# Patient Record
Sex: Male | Born: 1970 | Race: White | Hispanic: No | Marital: Married | Smoking: Never smoker
Health system: Southern US, Community
[De-identification: ages and names within clinical notes are randomized; demographics above are authoritative.]

## PROBLEM LIST (undated history)

## (undated) DIAGNOSIS — F419 Anxiety disorder, unspecified: Secondary | ICD-10-CM

## (undated) DIAGNOSIS — I10 Essential (primary) hypertension: Secondary | ICD-10-CM

## (undated) DIAGNOSIS — K589 Irritable bowel syndrome without diarrhea: Secondary | ICD-10-CM

## (undated) HISTORY — DX: Essential (primary) hypertension: I10

## (undated) HISTORY — PX: ANKLE ARTHROSCOPY W/ OPEN REPAIR: SHX1145

## (undated) HISTORY — PX: VASECTOMY: SHX75

## (undated) HISTORY — DX: Irritable bowel syndrome, unspecified: K58.9

## (undated) HISTORY — PX: VASECTOMY REVERSAL: SHX243

## (undated) HISTORY — DX: Anxiety disorder, unspecified: F41.9

---

## 2011-09-13 DIAGNOSIS — N401 Enlarged prostate with lower urinary tract symptoms: Secondary | ICD-10-CM | POA: Insufficient documentation

## 2011-09-13 DIAGNOSIS — N411 Chronic prostatitis: Secondary | ICD-10-CM | POA: Insufficient documentation

## 2011-09-15 DIAGNOSIS — E291 Testicular hypofunction: Secondary | ICD-10-CM | POA: Insufficient documentation

## 2011-09-15 DIAGNOSIS — N529 Male erectile dysfunction, unspecified: Secondary | ICD-10-CM | POA: Insufficient documentation

## 2011-09-15 DIAGNOSIS — R350 Frequency of micturition: Secondary | ICD-10-CM | POA: Insufficient documentation

## 2011-11-23 ENCOUNTER — Ambulatory Visit: Payer: Self-pay | Admitting: Family Medicine

## 2012-11-11 ENCOUNTER — Inpatient Hospital Stay: Payer: Self-pay | Admitting: Orthopedic Surgery

## 2013-03-07 ENCOUNTER — Ambulatory Visit: Payer: Self-pay | Admitting: Orthopedic Surgery

## 2013-05-17 DIAGNOSIS — Z8781 Personal history of (healed) traumatic fracture: Secondary | ICD-10-CM | POA: Insufficient documentation

## 2013-05-17 DIAGNOSIS — Z9889 Other specified postprocedural states: Secondary | ICD-10-CM | POA: Insufficient documentation

## 2013-09-25 ENCOUNTER — Ambulatory Visit: Payer: Self-pay | Admitting: Otolaryngology

## 2013-10-29 IMAGING — CR DG SHOULDER 3+V*R*
1 series · 3 of 3 positions shown · non-contrast
Comparison: none

REASON FOR EXAM: pain
COMMENTS:

PROCEDURE:     KDR - KDXR SHOULDER RIGHT COMPLETE  - November 23, 2011  [DATE]
RESULT:     Comparison: None.

[Series 1: internal rotate · 0.17mm/px · 3 of 3 slices shown]
[im 1/3]
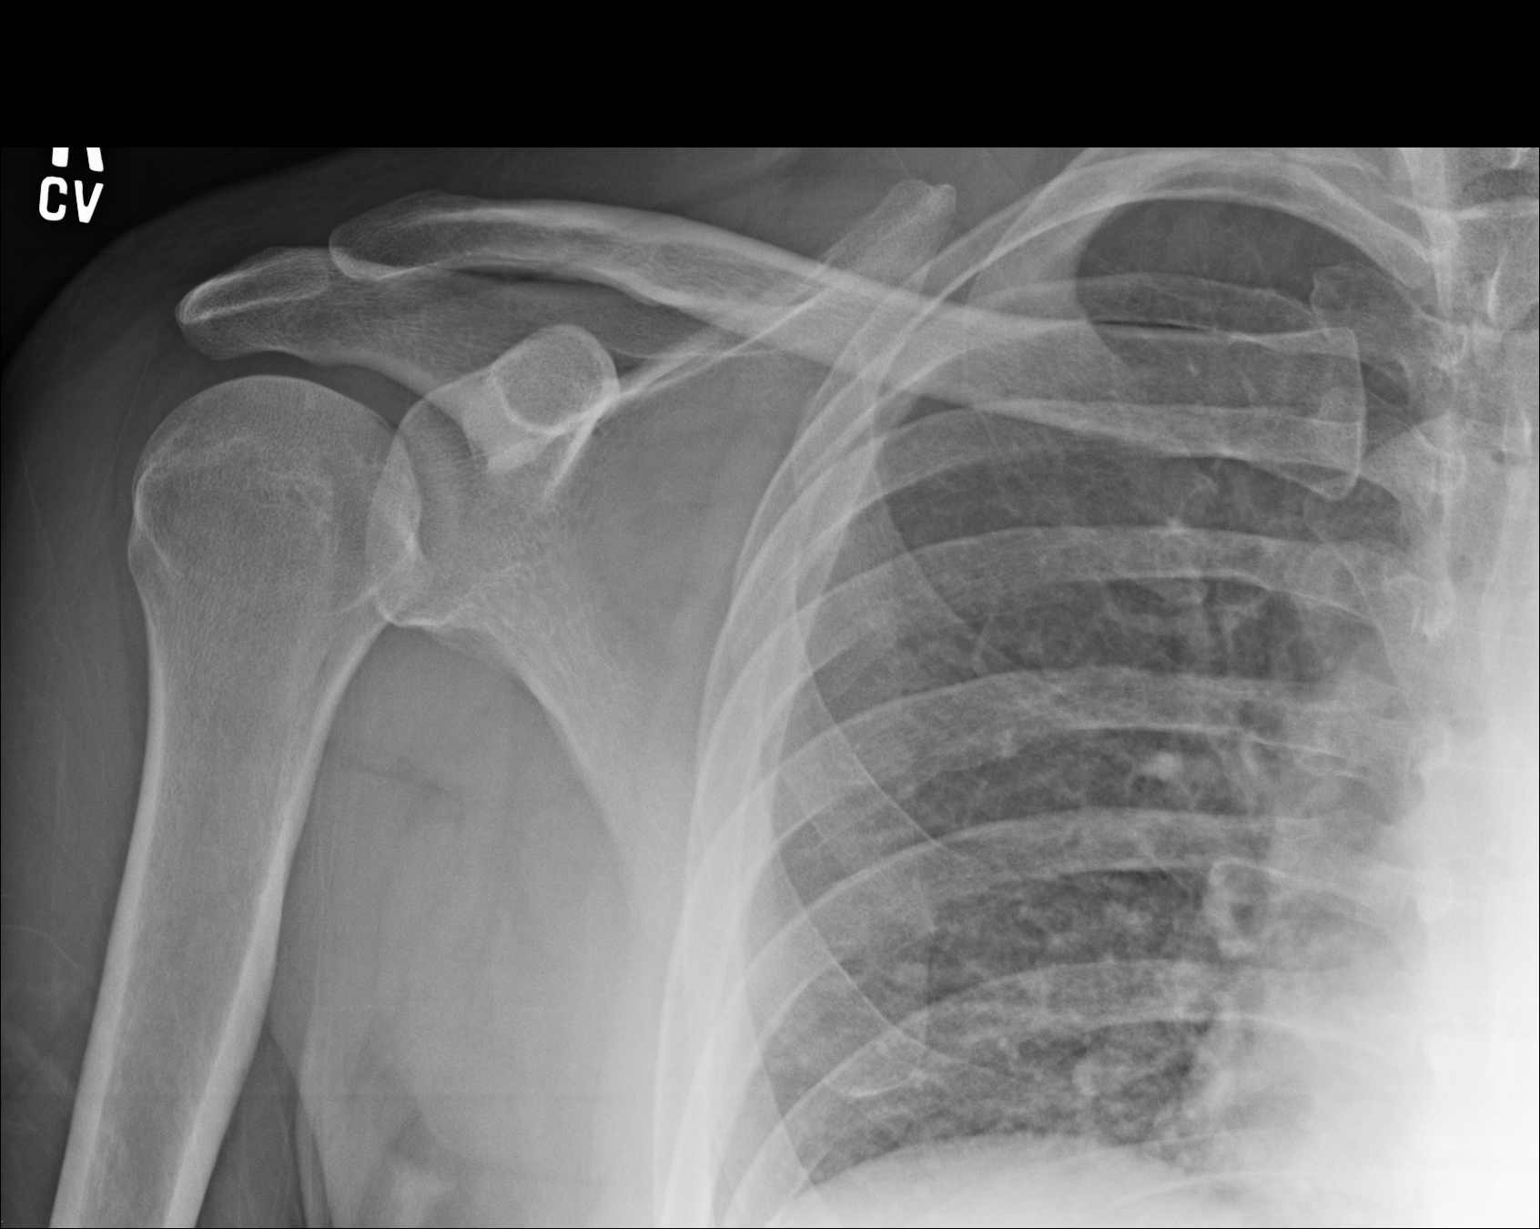
[im 2/3]
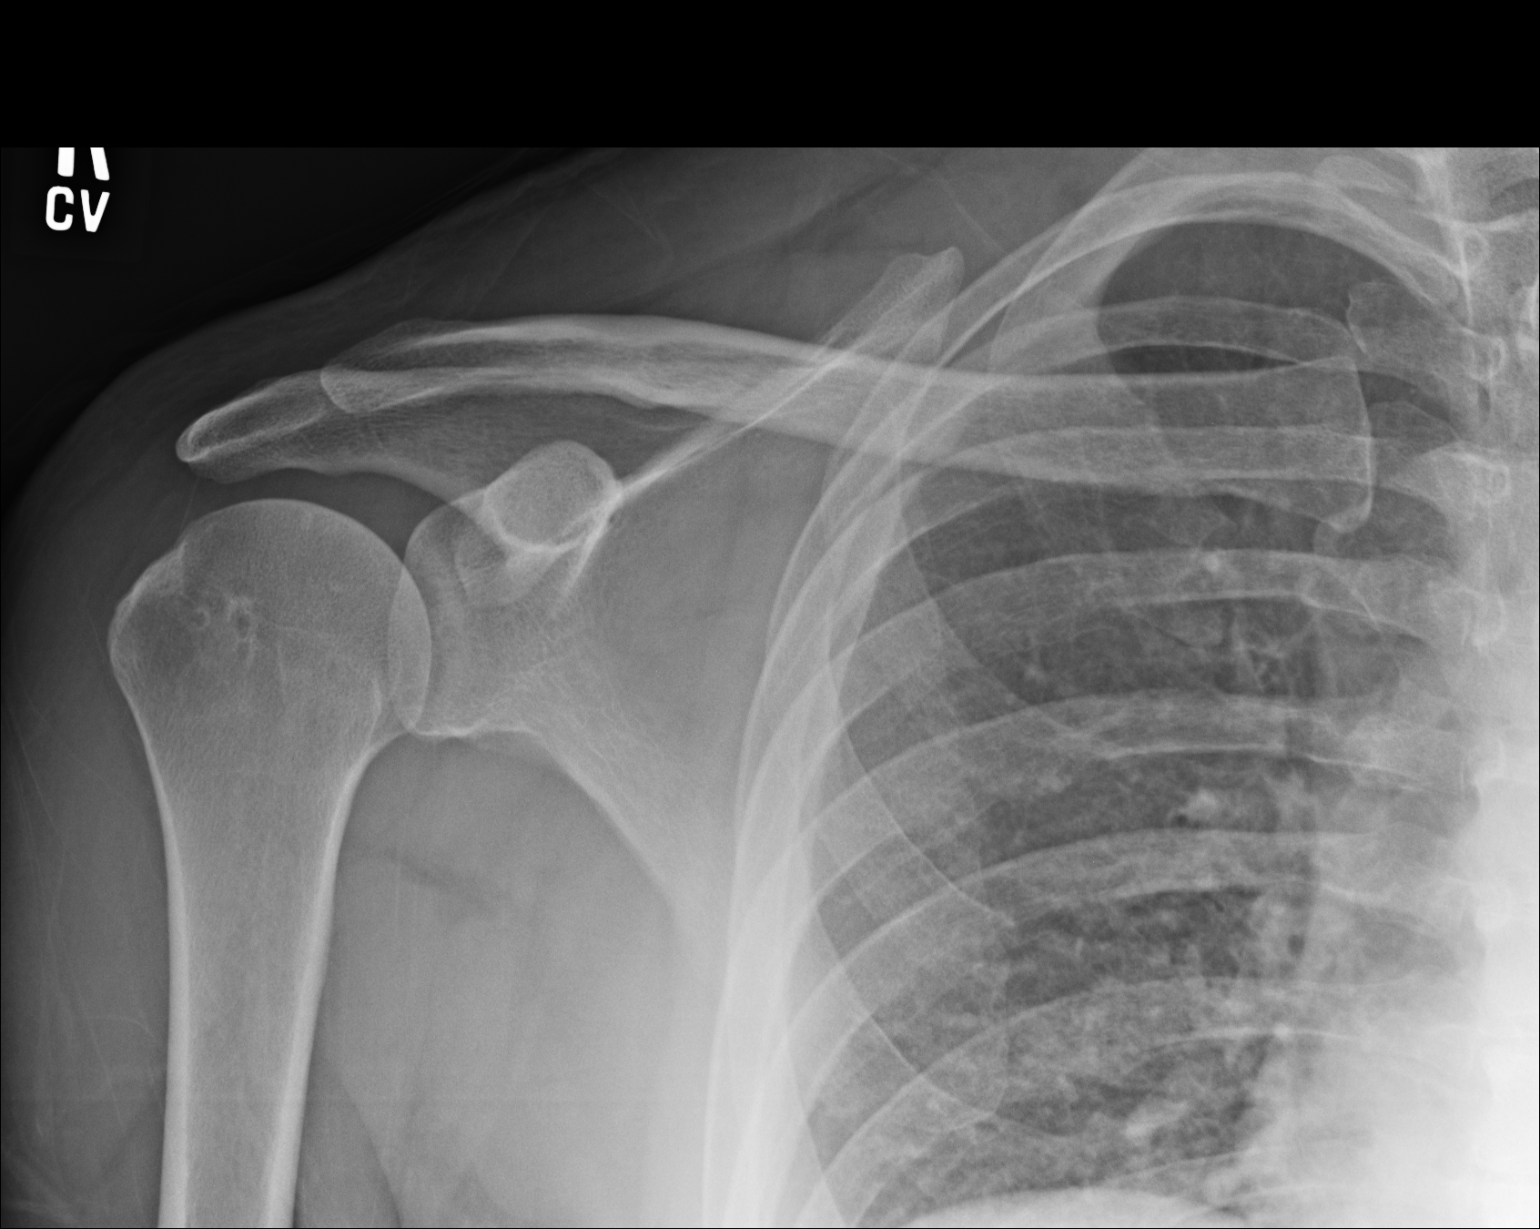
[im 3/3]
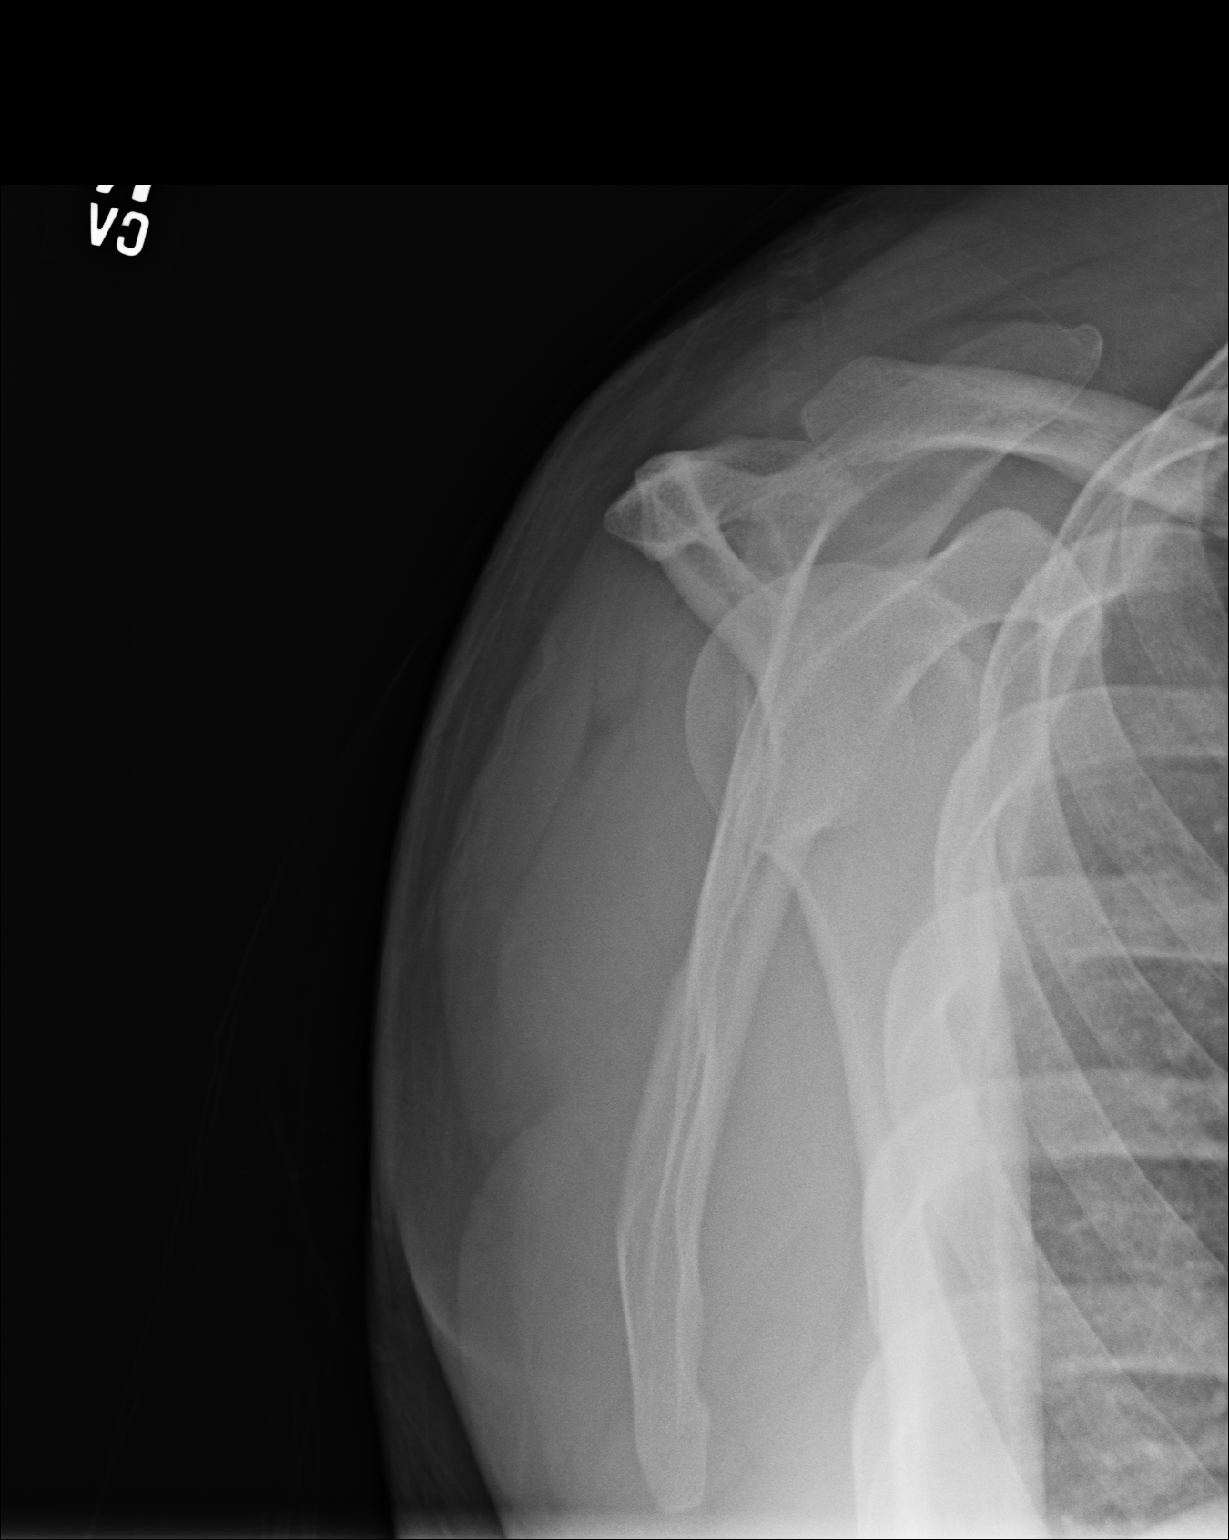

[3 of 3 positions shown; findings below may reference images not displayed]

FINDINGS: No acute fracture or dislocation. The acromioclavicular joint is
unremarkable.
IMPRESSION: No acute findings.

[REDACTED]

## 2013-10-29 IMAGING — CR DG LUMBAR SPINE 2-3V
1 series · 4 of 4 positions shown · non-contrast
Comparison: none

REASON FOR EXAM: lumbago back pain
COMMENTS:

PROCEDURE:     KDR - KDXR LUMBAR SPINE AP AND LATERAL  - November 23, 2011  [DATE]
RESULT:     Comparison: None.

[Series 1: ap · 0.17mm/px · 4 of 4 slices shown]
[im 1/4]
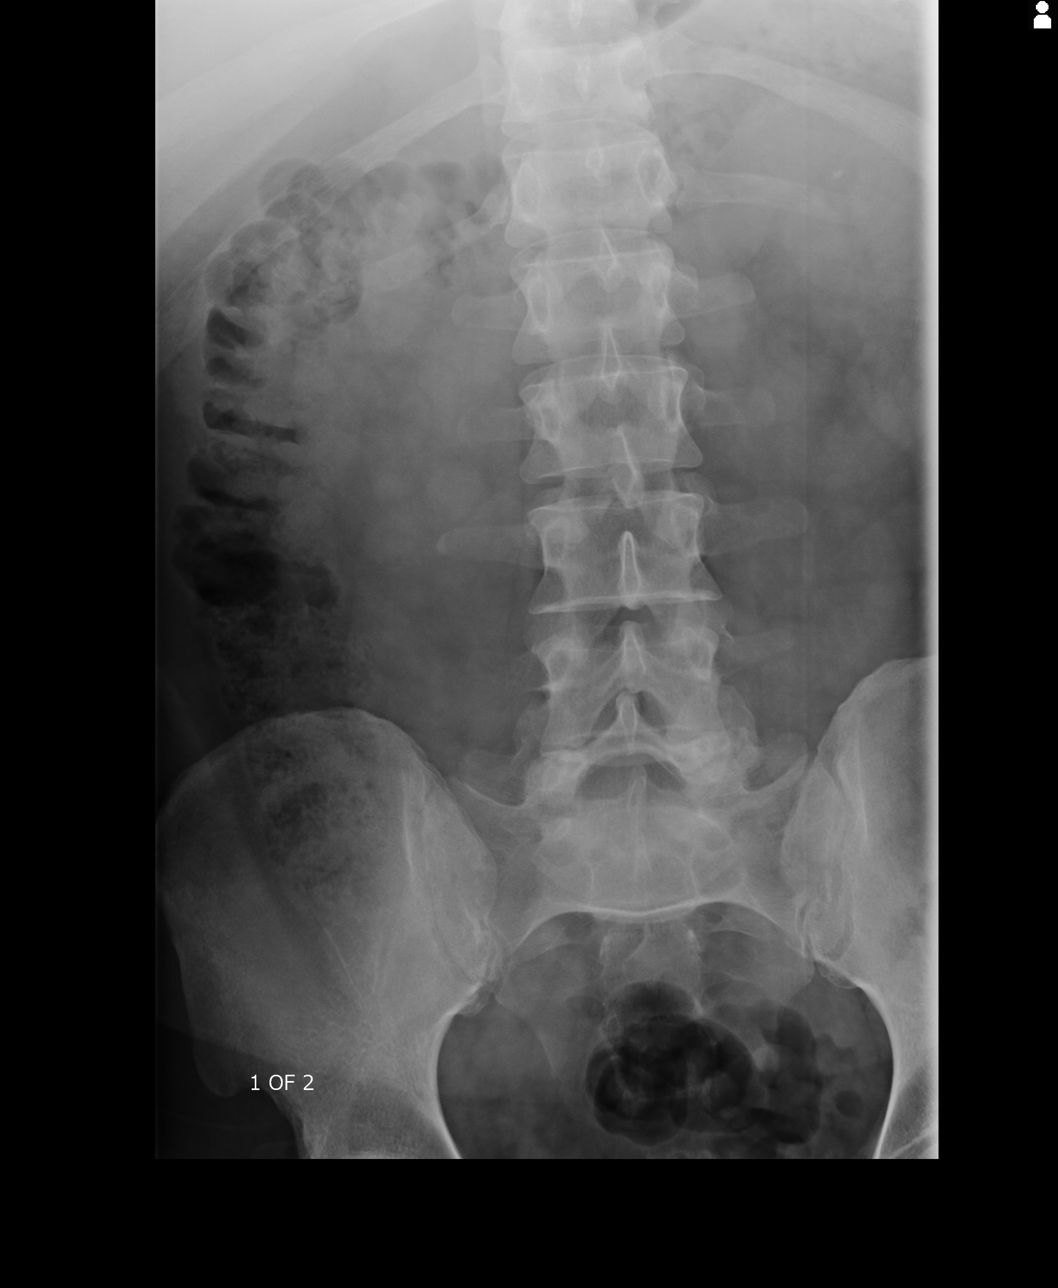
[im 2/4]
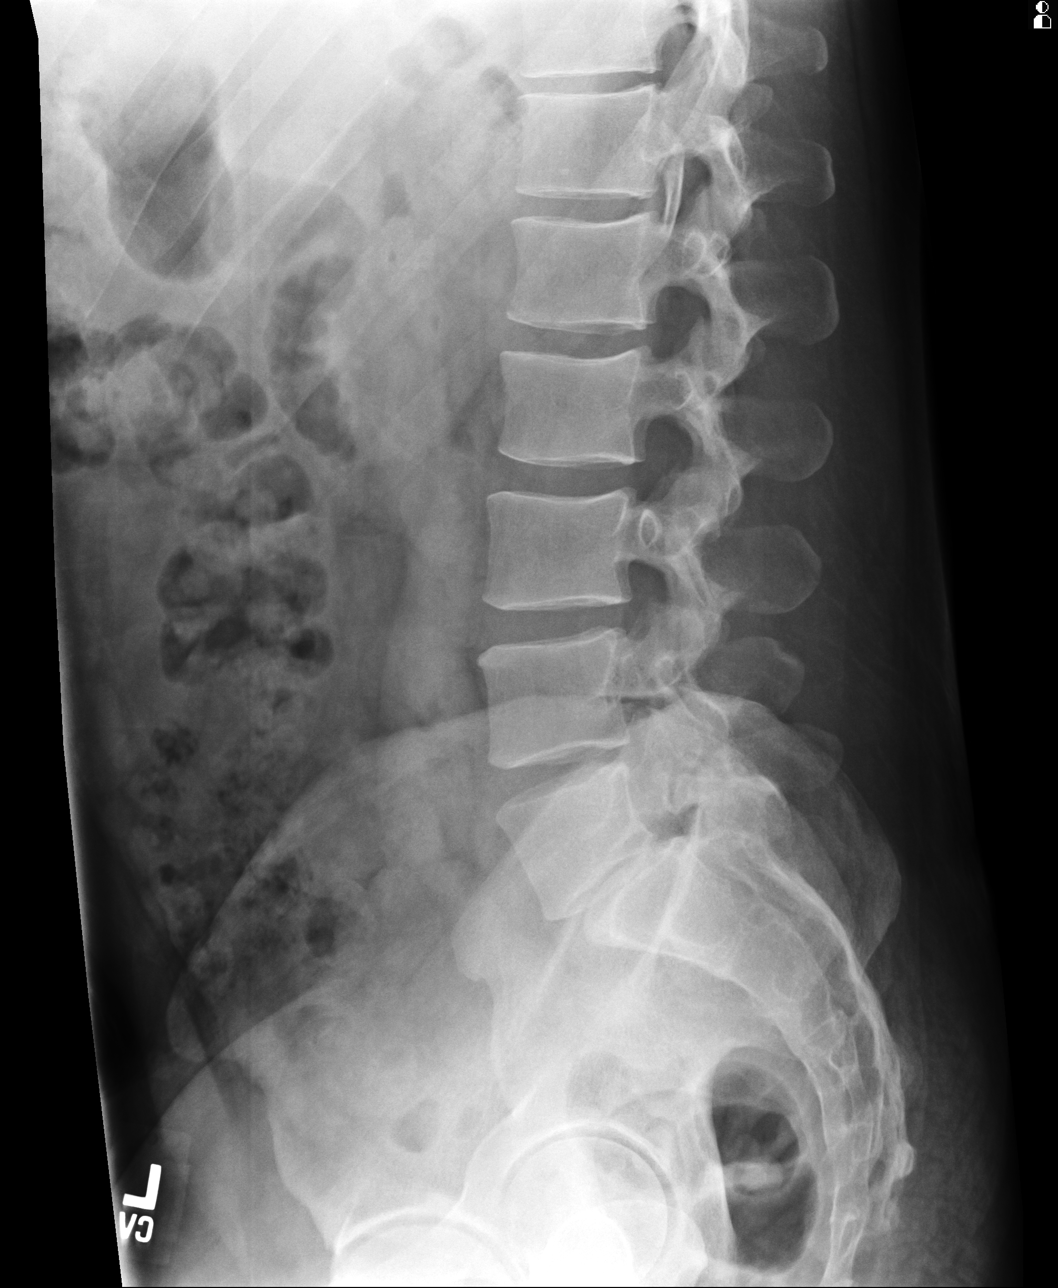
[im 3/4]
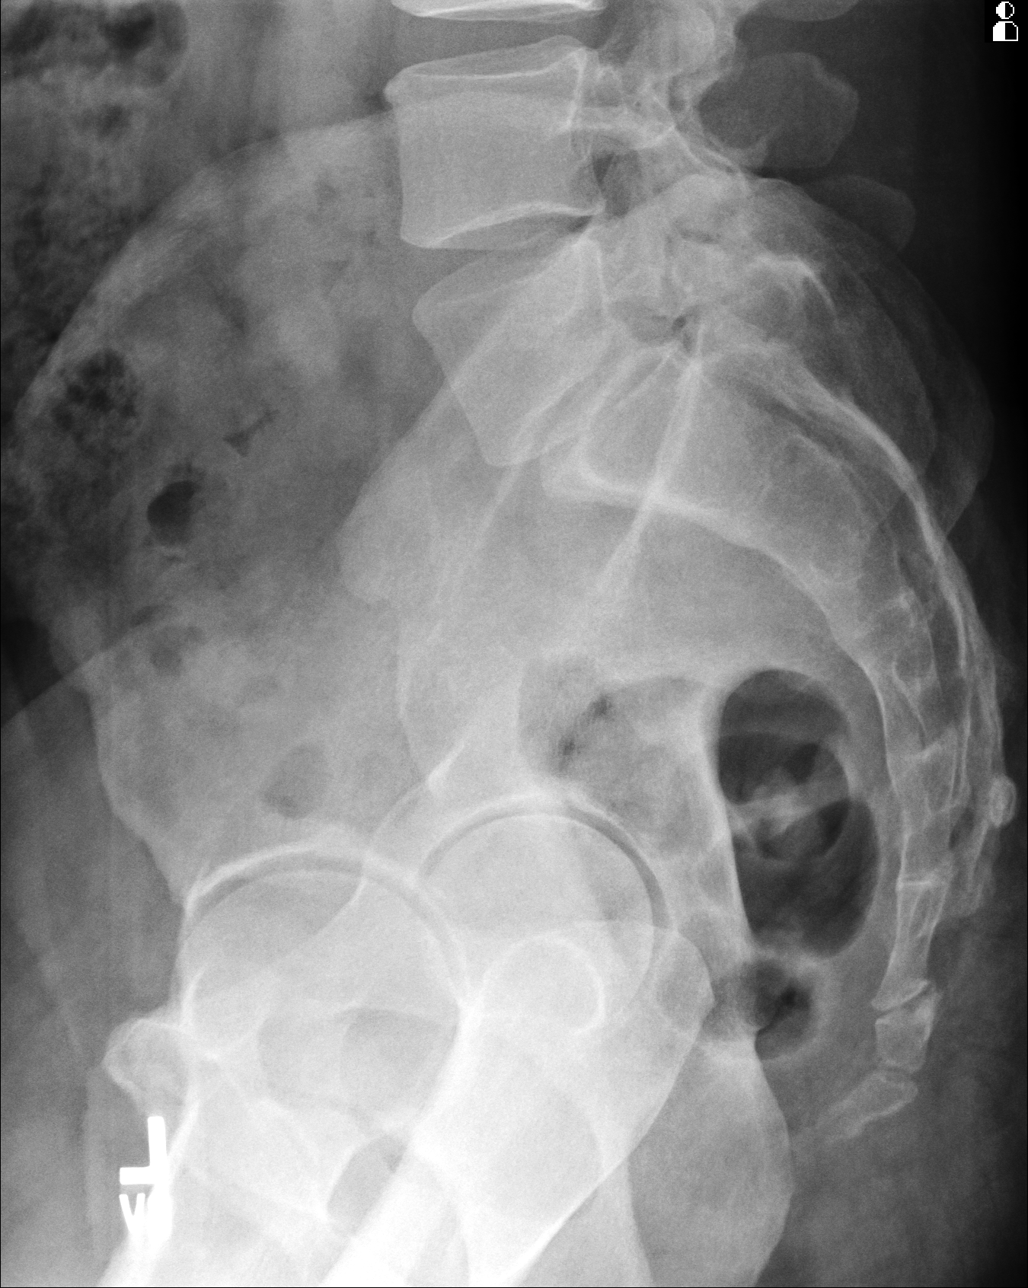
[im 4/4]
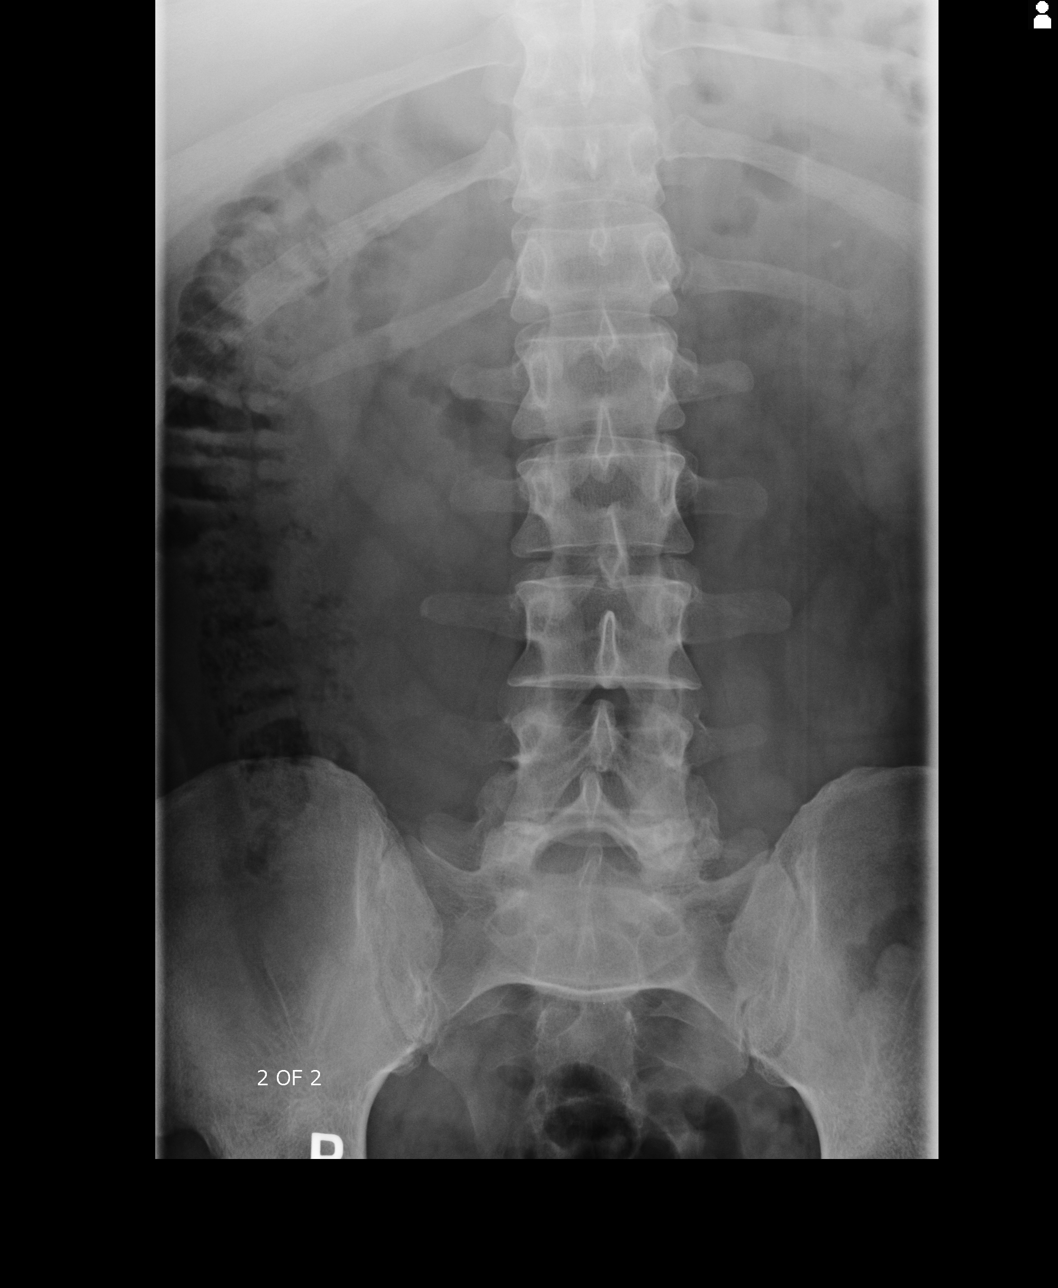

[4 of 4 positions shown; findings below may reference images not displayed]

FINDINGS: There are 5 lumbar type vertebral bodies. There is grade 1 anterolisthesis
of L5 on S1. There are possible pars defects at L5. There is mild
intervertebral disc height loss at L5-S1.
IMPRESSION: Grade 1 anterolisthesis of L5 on S1 with possible spondylolysis at L5.
Further evaluation could be provided with MRI, as indicated.

[REDACTED]

## 2014-04-23 DIAGNOSIS — Z79899 Other long term (current) drug therapy: Secondary | ICD-10-CM | POA: Insufficient documentation

## 2014-05-02 NOTE — Op Note (Signed)
PATIENT NAME:  Darryl Roy, Darryl Roy MR#:  [id number] DATE OF BIRTH:  1970-02-16  DATE OF PROCEDURE:  11/12/2012  PREOPERATIVE DIAGNOSIS: Left bimalleolar fracture with syndesmosis rupture.   POSTOPERATIVE DIAGNOSIS: Left bimalleolar fracture with syndesmosis rupture.   PROCEDURE: Open reduction internal fixation, left ankle syndesmosis.   ANESTHESIA: General.   SURGEON: Hessie Knows, M.D.   DESCRIPTION OF PROCEDURE: The patient was brought to the operating room, and after adequate anesthesia was obtained, a bump was placed underneath the left buttock to internally rotate the leg. The leg was prepped and draped in the usual sterile fashion with a tourniquet applied to the upper thigh. After patient identification and timeout procedures were completed, the tourniquet was raised to 300 mmHg.   An anterior medial incision was made and the deltoid ligament was identified and the joint opened. The joint was thoroughly irrigated. There were no large fragments identified within the ankle joint.   After thorough irrigation of the joint, attention was turned laterally where a small incision was made with the ankle held in a supinated position. With the fracture held in a reduced position, a 3.5 drill was used to drill across the fibula with 3.5 drill across the tibia and a 50 mm cortical screw was placed, the ankle in dorsiflexion as the ankle mortise was held in place.   Next, a slightly more proximal screw was inserted to give additional rigidity to the construct. The more proximal fibula fracture was left alone as it appeared well aligned. The medial joint was then thoroughly irrigated again and the deltoid repaired with a single 0 Vicryl suture followed by skin staples. Skin staples were used laterally. Following this, the wound was dressed with Xeroform, 4 x 4's, Webril and a stirrup splint. The patient was sent to the recovery room in stable condition. Tourniquet time was 24 minutes.   COMPLICATIONS:  There were no complications.   SPECIMEN: No specimen.   IMPLANTS: 3.5 mm cortical screws x2.   ____________________________ Laurene Footman, MD mjm:np D: 11/12/2012 18:06:20 ET T: 11/12/2012 20:03:07 ET JOB#: [id number]  cc: Laurene Footman, MD, <Dictator> Laurene Footman MD ELECTRONICALLY SIGNED 11/13/2012 7:21

## 2014-05-02 NOTE — Discharge Summary (Signed)
PATIENT NAME:  Darryl Roy, Darryl Roy MR#:  [id number] DATE OF BIRTH:  July 22, 1970  DATE OF ADMISSION:  11/11/2012 DATE OF DISCHARGE:  11/13/2012  ADMITTING DIAGNOSIS: Left bimalleolar ankle fracture.   DISCHARGE DIAGNOSIS: Left bimalleolar ankle fracture.    OPERATION: On 11/12/2012, the patient had an open reduction and internal fixation of his left ankle.   SURGEON: Laurene Footman, MD  ANESTHESIA: General.   ESTIMATED BLOOD LOSS: Minimal.   TOURNIQUET TIME: 24 minutes at 300 mmHg.   DRAINS: None.   IMPLANTS: Synthes 3.5 cortical screws x2.   COMPLICATIONS: None.   HISTORY: Mr. Darryl Roy is a 44 year old white male who was working at home on his roof and fell approximately 8 feet. He presented to [facility] ED the same day and found to have a dislocation of his left ankle with a distal fibular shaft fracture and posterior malleolar fragment. He was admitted to the hospital and underwent surgery on 11/12/2012.   PHYSICAL EXAMINATION:  GENERAL: Well-developed, well-nourished white male, appears his stated age, in a great deal of distress secondary to left ankle pain.  HEENT: Normocephalic, atraumatic. Normal. LUNGS: Clear.  HEART: Regular rate and rhythm.  ABDOMEN: Soft.  EXTREMITIES: Left leg is in a stirrup splint. Sensation intact to left foot with brisk capillary refill.   HOSPITAL COURSE: After initial admission on 11/11/2012, the patient underwent an ORIF of his left ankle on 11/12/2012. The next morning, he had 1 episode of emesis and severe pain. He has a known chronic pain history. He had 1 session with physical therapy and is nonweightbearing on the left lower extremity. His pain medications were adjusted, and he was discharged on pain medicines by mouth only.   CONDITION AT DISCHARGE: stable  DISPOSITION: Patient was sent home.  DISCHARGE INSTRUCTIONS: The patient will be nonweightbearing on the left lower extremity. He will use crutches. He should keep the splint on that was  applied in the OR until his followup appointment with [place] in 2 weeks. He should elevate the left foot.   DISCHARGE MEDICATIONS: Please see discharge instructions for full list of discharge medications.    ____________________________ Darryl Roy M. Darryl Sciara, NP amb:lb D: 11/15/2012 13:23:44 ET T: 11/15/2012 13:31:21 ET JOB#: [id number]  cc: Aairah Negrette M. Darryl Sciara, NP, <Dictator> Darryl Kays Aleeta Schmaltz FNP ELECTRONICALLY SIGNED 11/16/2012 9:50

## 2014-05-02 NOTE — H&P (Signed)
PATIENT NAME:  Darryl Roy, Darryl Roy MR#:  [id number] DATE OF BIRTH:  09-10-1970  DATE OF ADMISSION:  11/11/2012  CHIEF COMPLAINT: Left ankle pain and deformity.   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 His wife had a picture of that. It was immediately reduced which was appropriate and post-reduction x-rays show a distal fibular shaft fracture well above the ankle with small posterior malleolar fragment, no medial malleolar fragment. He is being admitted for treatment of unstable ankle fracture-dislocation.   PAST MEDICAL HISTORY: Remarkable for prior vasectomy, reversal and repeat vasectomy.   ALLERGIES:  He denies any drug allergies.   MEDICATIONS:  He currently is taking Norco 5 p.r.n. pain for shoulder and back problems, additionally Wellbutrin XL 300 daily, Ativan 2 mg b.i.d. p.r.n. and testosterone shot q.2 weeks.   SOCIAL HISTORY: He works in [employer], has problems with his L5 disk, irritable bowel.   REVIEW OF SYSTEMS: Positive just for the ankle pain. He denies any head or neck pain, loss of consciousness or other injury related to his fall.   PHYSICAL EXAMINATION: GENERAL: Well-developed, well-nourished white male appears his stated age, in a great deal of distress secondary to left ankle pain.  HEENT: Normocephalic, atraumatic.  LUNGS: Clear.  HEART: Regular rate and rhythm.  HEENT: Normal.  ABDOMEN: Soft.  EXTREMITIES:  Left leg is in a stirrup splint. Sensation intact to the foot with brisk capillary refill.   IMAGING:  X-rays as previously noted show widening of the medial clear space, more proximal fibula fracture, comminuted, and a small posterior malleolar fragment with minimal displacement. There also appears to be a loose body within the  joint on the lateral view.   CLINICAL IMPRESSION: Very unstable ankle fracture-dislocation.   RECOMMENDATION: For ORIF of the syndesmosis with multiple screws. I told him I plan on opening the medial side to irrigate out the joint to try to prevent there being a problem with loose body and we will do open deltoid repair at that time as it may have flipped into the joint as well. The risks, benefits and possible complications were discussed; in particular infection and blood clot, that I would like him to have aspirin daily after surgery. He will need to stay nonweightbearing for 6 weeks with the syndesmosis repair. After that time, there may be breakage of hardware or loosening of the hardware and it may need to be removed. He understands this and will plan on surgery tomorrow afternoon. Perioperative antibiotics were ordered.     ____________________________ Laurene Footman, MD mjm:cs D: 11/11/2012 18:55:31 ET T: 11/11/2012 19:05:09 ET JOB#: [id number]  cc: Laurene Footman, MD, <Dictator> Laurene Footman MD ELECTRONICALLY SIGNED 11/12/2012 7:35

## 2014-05-03 NOTE — Op Note (Signed)
PATIENT NAME:  Darryl Roy, NHAM MR#:  [id number] DATE OF BIRTH:  [birth year]  DATE OF PROCEDURE:  03/07/2013  PREOPERATIVE DIAGNOSIS: Failed repair, left ankle syndesmosis.   POSTOPERATIVE DIAGNOSIS:  Failed repair, left ankle syndesmosis.   PROCEDURE: Removal of prior hardware with open reduction internal fixation of syndesmosis.   ANESTHESIA: General.   SURGEON: Hessie Knows, M.D.   DESCRIPTION OF PROCEDURE: The patient was brought to the operating room and after adequate anesthesia was obtained, the left ankle was prepped and draped in the usual sterile fashion with a bump underneath the left buttock and a tourniquet applied to the upper thigh. After prepping and draping in the usual sterile fashion, appropriate patient identification and timeout procedure were performed and the tourniquet was raised. The lateral incision was made initially connecting the prior percutaneous screw incisions to a standard lateral approach to the distal fibula. Screw heads were identified and removed without difficulty, the more distal screw having broken.  The remaining screw was left in the distal tibia. The prior medial incision was then opened and anterior medial arthrotomy carried out, clearing out debris and scar in the medial gutter of the tibiotalar articulation. Next, the anterior distal tib-fib joint was opened and scar tissue removed to allow for adequate reduction of the syndesmosis. With the use of a curette and rongeur were carried out and the joint and the syndesmosis were thoroughly irrigated. After this had been performed, a large reduction clamp was applied and acceptable reduction of the syndesmosis was obtained with this clamp. A 5 hole third tubular plate was then applied to the distal fibula in the appropriate level and a single screw inserted with the reduction held in place with a clamp. A 50 mm cortical screw, getting through the fibula and well into the tibia, appeared to give initial stable  fixation and the clamp could be removed. Through the two remaining distal screw holes, two tightrope anchors were placed. Drilling, placing the tightrope,  tensioning it and then tying the suture at the head to aid in stability. This, again, appeared to give good stability to stress views without widening of the mortise.  Clear space, medially and laterally appeared equal. The two proximal screw holes were then filled to give additional rigidity to the repair, drilling through the fibula into the tibia and placing 3.5 cortical screws with 3 cortices of fixation. At this point, all wounds were thoroughly irrigated. The wounds were closed with 3-0 Vicryl subcutaneously and skin staples. Xeroform, 4 x 4's, Webril and a stirrup splint were applied along with an Ace wrap, after infiltration of 20 mL 0.5% Sensorcaine in the area of the incisions to aid in postoperative analgesia.   ESTIMATED BLOOD LOSS: Minimal.   COMPLICATIONS: None.   SPECIMEN: None.   TOURNIQUET TIME: 81 minutes at 300 mmHg   IMPLANTS: Synthes 5 hole third tubular plate with 3 cortical screws, Arthrex tightrope syndesmosis fixation x 2.   ____________________________ Laurene Footman, MD mjm:dp D: 03/07/2013 19:05:18 ET T: 03/08/2013 06:49:42 ET JOB#: [id number]  cc: Laurene Footman, MD, <Dictator> Laurene Footman MD ELECTRONICALLY SIGNED 03/08/2013 7:39

## 2014-10-18 IMAGING — CR DG TIBIA/FIBULA 2V*L*
1 series · 3 of 3 positions shown · non-contrast
Comparison: none

RESULT:      Left mid tibia dated 10/11/2012

[Series 1: ap · 0.17mm/px · 3 of 3 slices shown]
[im 1/3]
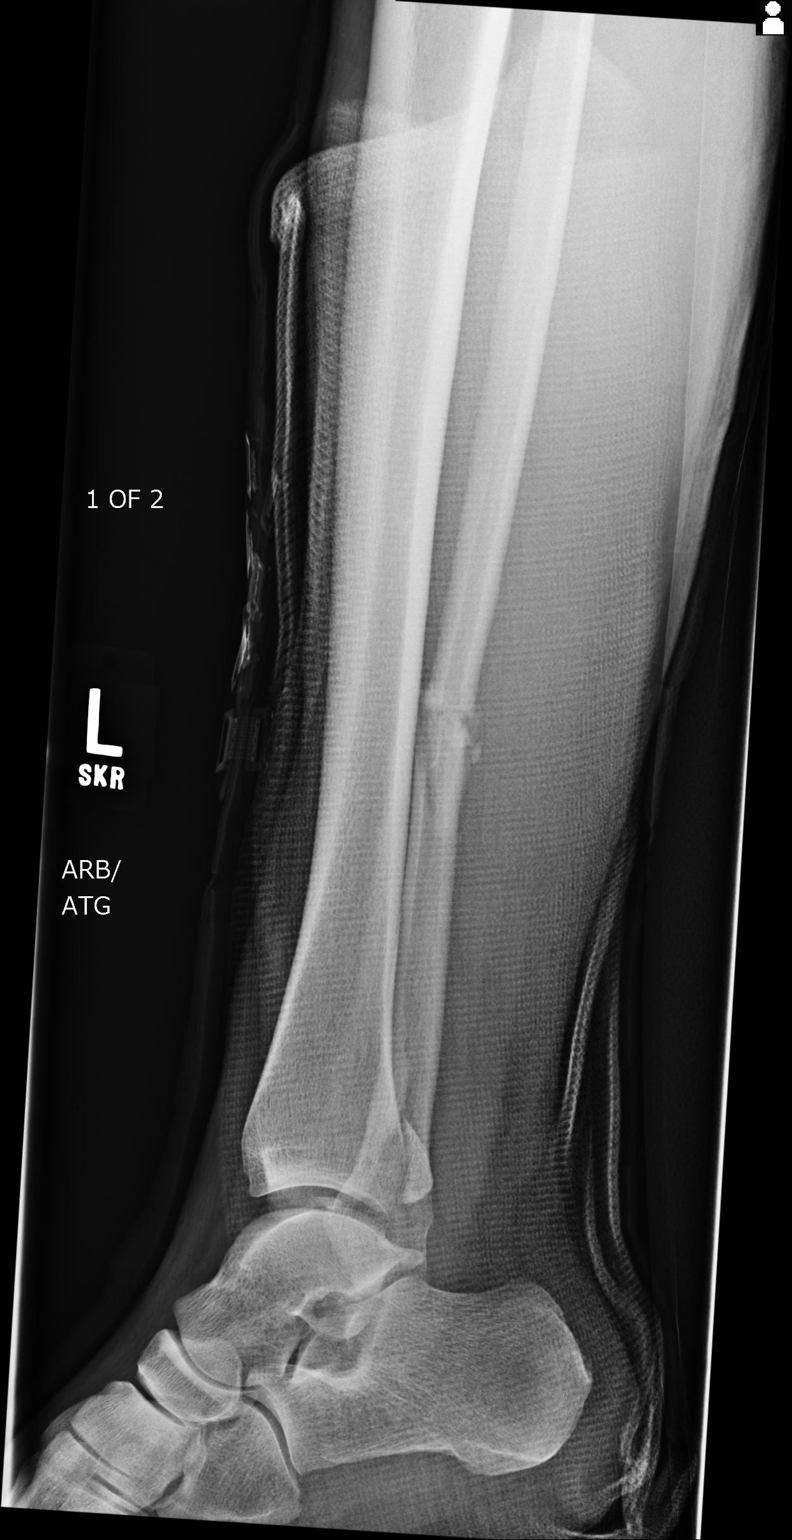
[im 2/3]
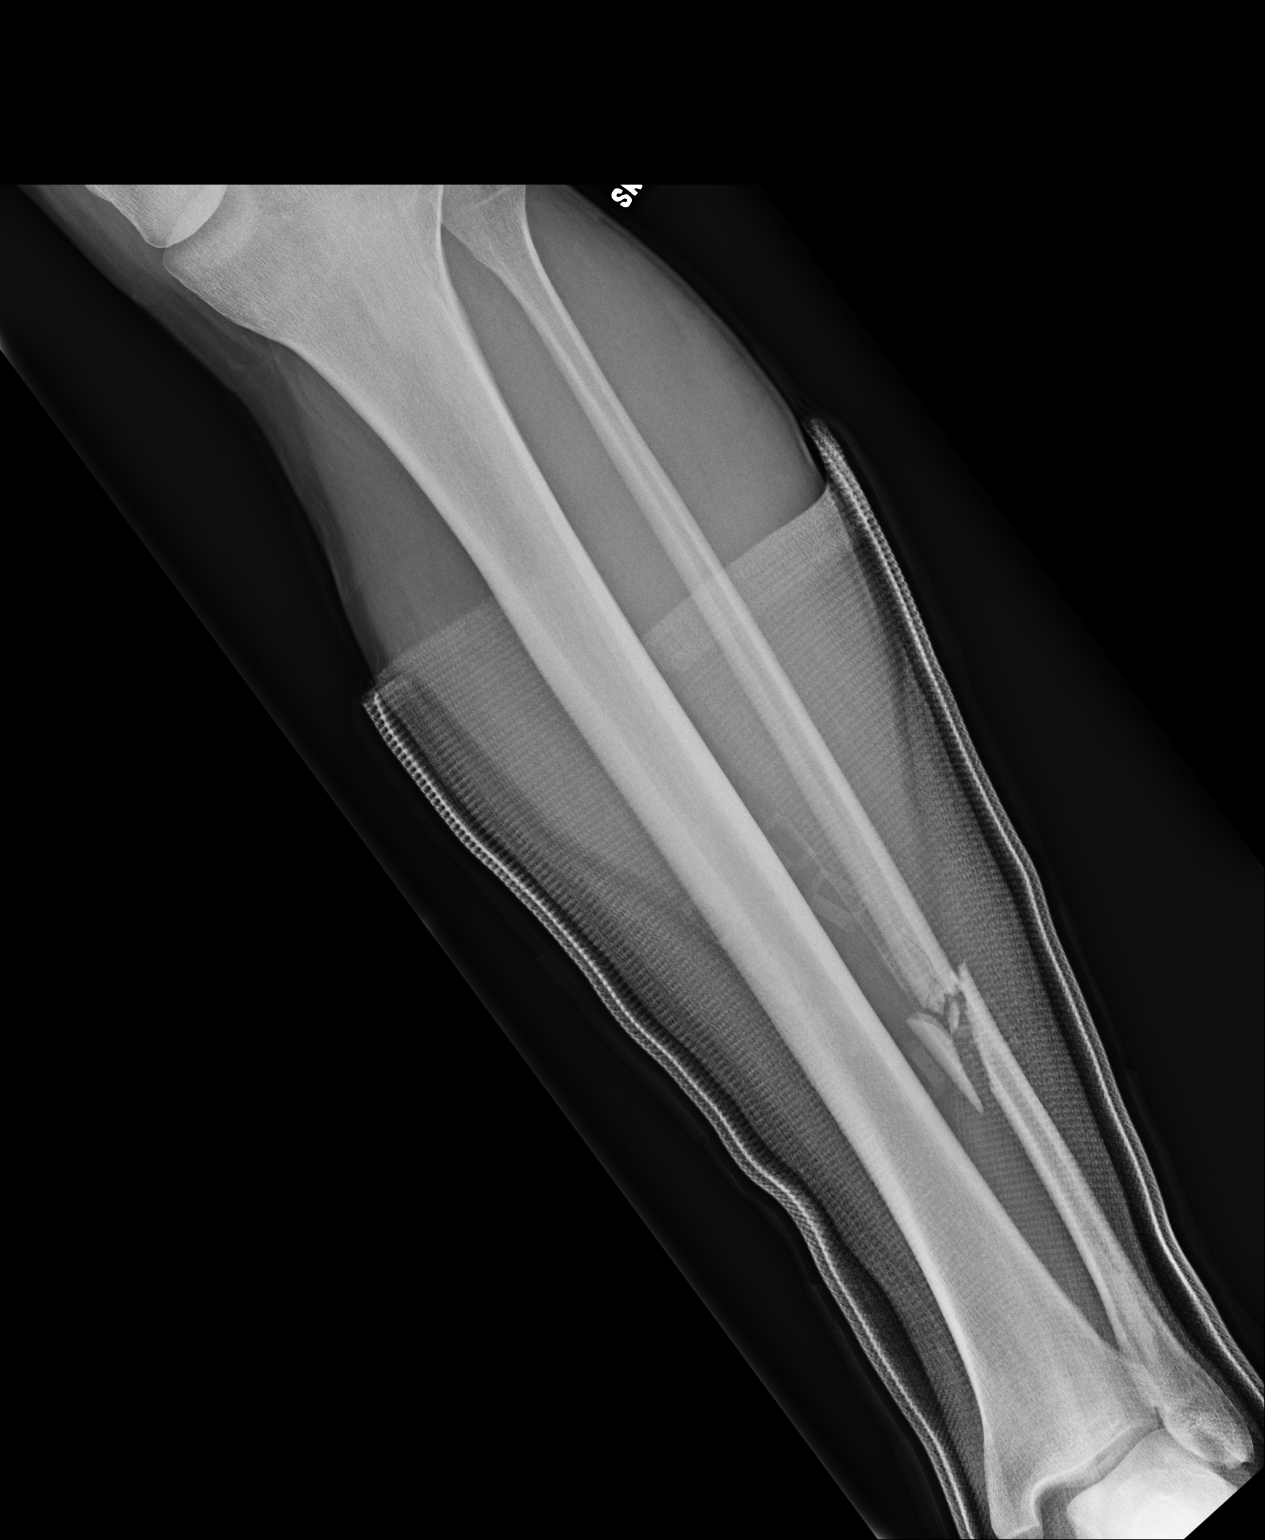
[im 3/3]
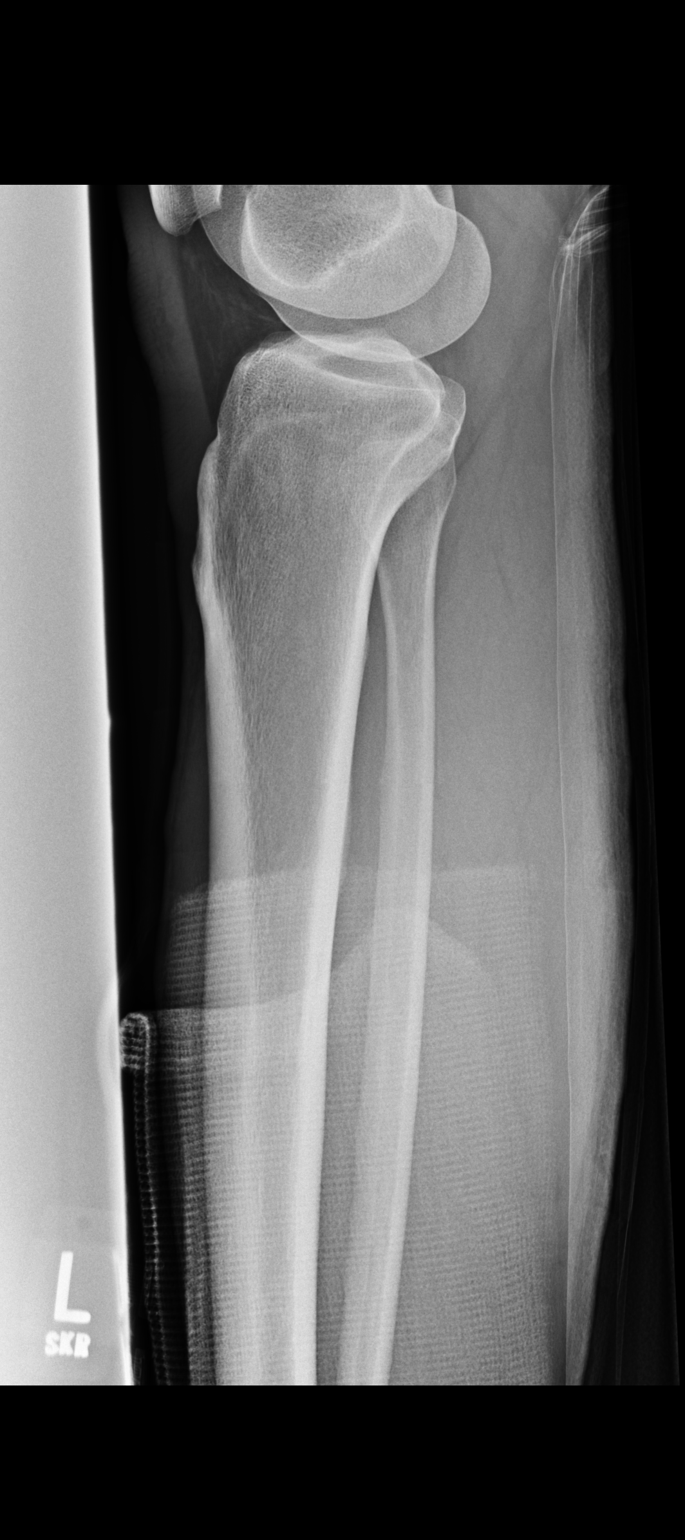

[3 of 3 positions shown; findings below may reference images not displayed]

FINDINGS: Comminuted distal fracture shaft fractures identified.  Lateral
angulation, comminution, distraction of approximately 4 to 5 mm and a
minimal amount of override are appreciated. There also findings concerning
for lateral malleolar fracture and possibly a posterior malleolar fracture.
IMPRESSION: Distal fibular fracture
2. Study degraded by overlying cast material findings concerning for ankle
fractures as described above

## 2015-01-01 DIAGNOSIS — R339 Retention of urine, unspecified: Secondary | ICD-10-CM | POA: Insufficient documentation

## 2015-02-11 IMAGING — CR DG ANKLE 2V *L*
1 series · 2 of 2 positions shown · non-contrast
Comparison: DG ANKLE 2V *L* dated 11/12/2012

CLINICAL DATA: Postop.  Syndesmosis repair

EXAM:
LEFT ANKLE - 2 VIEW

[Series 1: ap · 0.17mm/px · 2 of 2 slices shown]
[im 1/2]
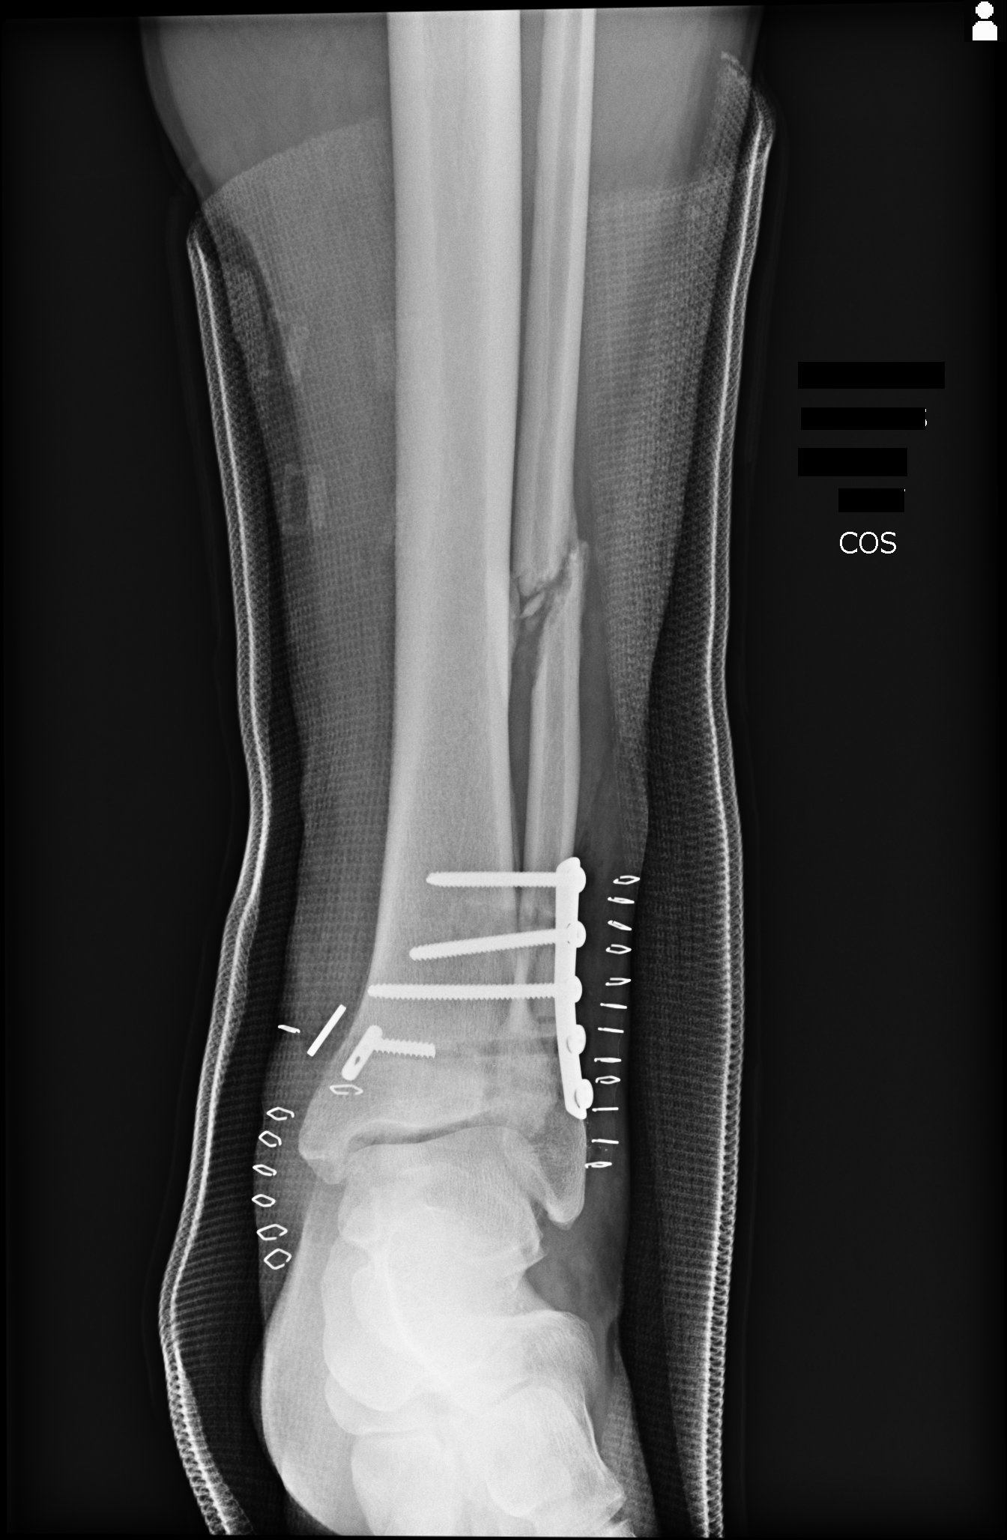
[im 2/2]
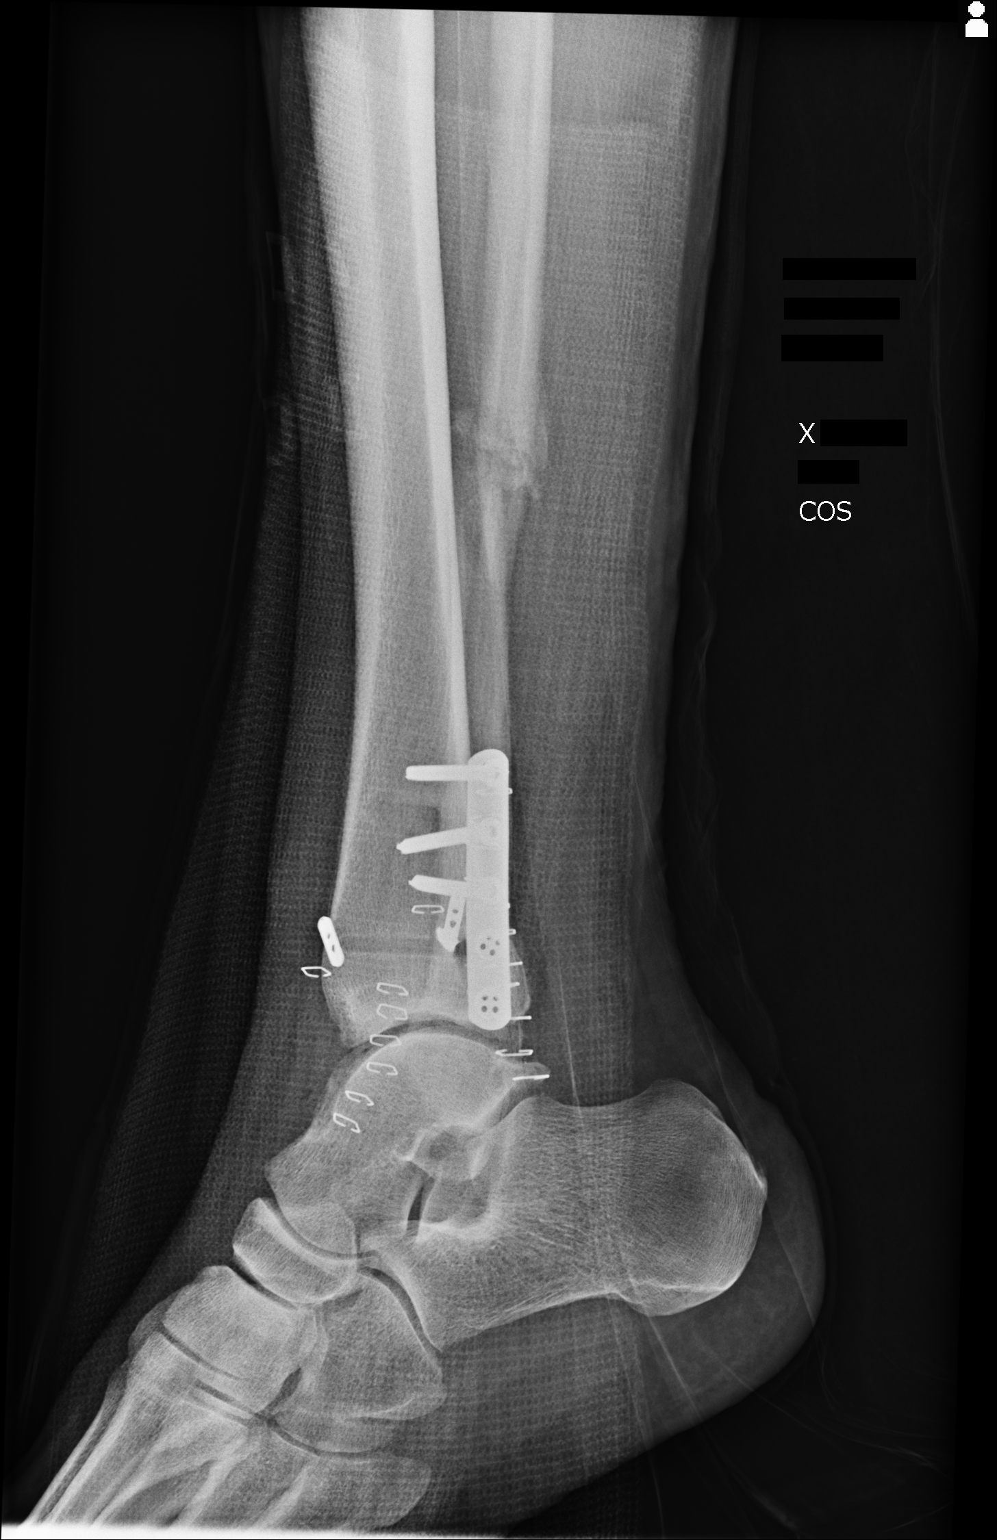

[2 of 2 positions shown; findings below may reference images not displayed]

FINDINGS: The patient has undergone interval placement of a sideplate over the
distal fibular metadiaphysis. Three cortical screws are present. A
portion of an old screw remains inferior to this in the medial
aspect of the tibial metaphysis. Surgical skin staples are present
bilaterally. There is a comminuted mildly displaced fracture of the
junction of the middle and distal thirds of the fibula above the
site of surgery.
IMPRESSION: 1. The patient has undergone repeat syndesmosis repair of the tibia
and fibula.
2. There is a fracture of the distal fibular shaft at the junction
of the middle and distal thirds which exhibits comminution and mild
displacement.

## 2015-03-18 DIAGNOSIS — M19172 Post-traumatic osteoarthritis, left ankle and foot: Secondary | ICD-10-CM | POA: Insufficient documentation

## 2015-05-04 DIAGNOSIS — Z79899 Other long term (current) drug therapy: Secondary | ICD-10-CM | POA: Diagnosis not present

## 2015-05-04 DIAGNOSIS — M13142 Monoarthritis, not elsewhere classified, left hand: Secondary | ICD-10-CM | POA: Diagnosis not present

## 2015-05-04 DIAGNOSIS — Z1389 Encounter for screening for other disorder: Secondary | ICD-10-CM | POA: Diagnosis not present

## 2015-05-04 DIAGNOSIS — G8929 Other chronic pain: Secondary | ICD-10-CM | POA: Diagnosis not present

## 2015-05-04 DIAGNOSIS — I10 Essential (primary) hypertension: Secondary | ICD-10-CM | POA: Diagnosis not present

## 2015-05-04 DIAGNOSIS — Z6829 Body mass index (BMI) 29.0-29.9, adult: Secondary | ICD-10-CM | POA: Diagnosis not present

## 2015-08-03 DIAGNOSIS — G4700 Insomnia, unspecified: Secondary | ICD-10-CM | POA: Diagnosis not present

## 2015-08-03 DIAGNOSIS — F410 Panic disorder [episodic paroxysmal anxiety] without agoraphobia: Secondary | ICD-10-CM | POA: Diagnosis not present

## 2015-08-03 DIAGNOSIS — I10 Essential (primary) hypertension: Secondary | ICD-10-CM | POA: Diagnosis not present

## 2015-08-03 DIAGNOSIS — G8929 Other chronic pain: Secondary | ICD-10-CM | POA: Diagnosis not present

## 2015-10-01 DIAGNOSIS — Z79899 Other long term (current) drug therapy: Secondary | ICD-10-CM | POA: Diagnosis not present

## 2015-10-01 DIAGNOSIS — E291 Testicular hypofunction: Secondary | ICD-10-CM | POA: Diagnosis not present

## 2015-10-01 DIAGNOSIS — N411 Chronic prostatitis: Secondary | ICD-10-CM | POA: Diagnosis not present

## 2015-10-01 DIAGNOSIS — N401 Enlarged prostate with lower urinary tract symptoms: Secondary | ICD-10-CM | POA: Diagnosis not present

## 2015-10-07 DIAGNOSIS — E291 Testicular hypofunction: Secondary | ICD-10-CM | POA: Diagnosis not present

## 2015-10-07 DIAGNOSIS — Z79899 Other long term (current) drug therapy: Secondary | ICD-10-CM | POA: Diagnosis not present

## 2015-10-07 DIAGNOSIS — N401 Enlarged prostate with lower urinary tract symptoms: Secondary | ICD-10-CM | POA: Diagnosis not present

## 2015-11-03 DIAGNOSIS — G4700 Insomnia, unspecified: Secondary | ICD-10-CM | POA: Diagnosis not present

## 2015-11-03 DIAGNOSIS — G8929 Other chronic pain: Secondary | ICD-10-CM | POA: Diagnosis not present

## 2015-11-03 DIAGNOSIS — M19179 Post-traumatic osteoarthritis, unspecified ankle and foot: Secondary | ICD-10-CM | POA: Diagnosis not present

## 2015-11-03 DIAGNOSIS — I10 Essential (primary) hypertension: Secondary | ICD-10-CM | POA: Diagnosis not present

## 2015-11-03 DIAGNOSIS — Z79899 Other long term (current) drug therapy: Secondary | ICD-10-CM | POA: Diagnosis not present

## 2016-02-03 DIAGNOSIS — M19179 Post-traumatic osteoarthritis, unspecified ankle and foot: Secondary | ICD-10-CM | POA: Diagnosis not present

## 2016-02-03 DIAGNOSIS — F410 Panic disorder [episodic paroxysmal anxiety] without agoraphobia: Secondary | ICD-10-CM | POA: Diagnosis not present

## 2016-02-03 DIAGNOSIS — G8929 Other chronic pain: Secondary | ICD-10-CM | POA: Diagnosis not present

## 2016-02-03 DIAGNOSIS — I10 Essential (primary) hypertension: Secondary | ICD-10-CM | POA: Diagnosis not present

## 2016-04-04 DIAGNOSIS — Z79899 Other long term (current) drug therapy: Secondary | ICD-10-CM | POA: Diagnosis not present

## 2016-04-04 DIAGNOSIS — G8929 Other chronic pain: Secondary | ICD-10-CM | POA: Diagnosis not present

## 2016-04-04 DIAGNOSIS — F410 Panic disorder [episodic paroxysmal anxiety] without agoraphobia: Secondary | ICD-10-CM | POA: Diagnosis not present

## 2016-04-04 DIAGNOSIS — I10 Essential (primary) hypertension: Secondary | ICD-10-CM | POA: Diagnosis not present

## 2016-04-04 DIAGNOSIS — M19179 Post-traumatic osteoarthritis, unspecified ankle and foot: Secondary | ICD-10-CM | POA: Diagnosis not present

## 2016-06-07 DIAGNOSIS — M19179 Post-traumatic osteoarthritis, unspecified ankle and foot: Secondary | ICD-10-CM | POA: Diagnosis not present

## 2016-06-07 DIAGNOSIS — G8929 Other chronic pain: Secondary | ICD-10-CM | POA: Diagnosis not present

## 2016-06-07 DIAGNOSIS — I10 Essential (primary) hypertension: Secondary | ICD-10-CM | POA: Diagnosis not present

## 2016-06-07 DIAGNOSIS — Z1389 Encounter for screening for other disorder: Secondary | ICD-10-CM | POA: Diagnosis not present

## 2016-06-07 DIAGNOSIS — Z79899 Other long term (current) drug therapy: Secondary | ICD-10-CM | POA: Diagnosis not present

## 2016-06-07 DIAGNOSIS — G4700 Insomnia, unspecified: Secondary | ICD-10-CM | POA: Diagnosis not present

## 2016-08-10 DIAGNOSIS — Z79899 Other long term (current) drug therapy: Secondary | ICD-10-CM | POA: Diagnosis not present

## 2016-08-10 DIAGNOSIS — M19179 Post-traumatic osteoarthritis, unspecified ankle and foot: Secondary | ICD-10-CM | POA: Diagnosis not present

## 2016-08-10 DIAGNOSIS — G4700 Insomnia, unspecified: Secondary | ICD-10-CM | POA: Diagnosis not present

## 2016-08-10 DIAGNOSIS — G8929 Other chronic pain: Secondary | ICD-10-CM | POA: Diagnosis not present

## 2016-08-10 DIAGNOSIS — I10 Essential (primary) hypertension: Secondary | ICD-10-CM | POA: Diagnosis not present

## 2016-10-13 DIAGNOSIS — Z6828 Body mass index (BMI) 28.0-28.9, adult: Secondary | ICD-10-CM | POA: Diagnosis not present

## 2016-10-13 DIAGNOSIS — I10 Essential (primary) hypertension: Secondary | ICD-10-CM | POA: Diagnosis not present

## 2016-10-13 DIAGNOSIS — M19179 Post-traumatic osteoarthritis, unspecified ankle and foot: Secondary | ICD-10-CM | POA: Diagnosis not present

## 2016-10-13 DIAGNOSIS — G4700 Insomnia, unspecified: Secondary | ICD-10-CM | POA: Diagnosis not present

## 2016-11-10 DIAGNOSIS — E291 Testicular hypofunction: Secondary | ICD-10-CM | POA: Diagnosis not present

## 2016-11-10 DIAGNOSIS — N401 Enlarged prostate with lower urinary tract symptoms: Secondary | ICD-10-CM | POA: Diagnosis not present

## 2016-11-10 DIAGNOSIS — Z79899 Other long term (current) drug therapy: Secondary | ICD-10-CM | POA: Diagnosis not present

## 2016-11-10 DIAGNOSIS — N411 Chronic prostatitis: Secondary | ICD-10-CM | POA: Diagnosis not present

## 2016-12-13 DIAGNOSIS — Z79899 Other long term (current) drug therapy: Secondary | ICD-10-CM | POA: Diagnosis not present

## 2016-12-13 DIAGNOSIS — M19179 Post-traumatic osteoarthritis, unspecified ankle and foot: Secondary | ICD-10-CM | POA: Diagnosis not present

## 2016-12-13 DIAGNOSIS — G4700 Insomnia, unspecified: Secondary | ICD-10-CM | POA: Diagnosis not present

## 2016-12-13 DIAGNOSIS — I10 Essential (primary) hypertension: Secondary | ICD-10-CM | POA: Diagnosis not present

## 2017-02-13 DIAGNOSIS — M19179 Post-traumatic osteoarthritis, unspecified ankle and foot: Secondary | ICD-10-CM | POA: Diagnosis not present

## 2017-02-13 DIAGNOSIS — I10 Essential (primary) hypertension: Secondary | ICD-10-CM | POA: Diagnosis not present

## 2017-02-13 DIAGNOSIS — B009 Herpesviral infection, unspecified: Secondary | ICD-10-CM | POA: Diagnosis not present

## 2017-02-13 DIAGNOSIS — G4700 Insomnia, unspecified: Secondary | ICD-10-CM | POA: Diagnosis not present

## 2017-03-07 DIAGNOSIS — R05 Cough: Secondary | ICD-10-CM | POA: Diagnosis not present

## 2017-03-07 DIAGNOSIS — J180 Bronchopneumonia, unspecified organism: Secondary | ICD-10-CM | POA: Diagnosis not present

## 2017-03-27 DIAGNOSIS — I10 Essential (primary) hypertension: Secondary | ICD-10-CM | POA: Diagnosis not present

## 2017-03-27 DIAGNOSIS — J189 Pneumonia, unspecified organism: Secondary | ICD-10-CM | POA: Diagnosis not present

## 2017-03-27 DIAGNOSIS — G4700 Insomnia, unspecified: Secondary | ICD-10-CM | POA: Diagnosis not present

## 2017-03-27 DIAGNOSIS — M19179 Post-traumatic osteoarthritis, unspecified ankle and foot: Secondary | ICD-10-CM | POA: Diagnosis not present

## 2017-04-13 DIAGNOSIS — H903 Sensorineural hearing loss, bilateral: Secondary | ICD-10-CM | POA: Diagnosis not present

## 2017-04-13 DIAGNOSIS — H9311 Tinnitus, right ear: Secondary | ICD-10-CM | POA: Diagnosis not present

## 2017-05-23 DIAGNOSIS — N401 Enlarged prostate with lower urinary tract symptoms: Secondary | ICD-10-CM | POA: Diagnosis not present

## 2017-05-23 DIAGNOSIS — Z79891 Long term (current) use of opiate analgesic: Secondary | ICD-10-CM | POA: Diagnosis not present

## 2017-05-23 DIAGNOSIS — Z79890 Hormone replacement therapy (postmenopausal): Secondary | ICD-10-CM | POA: Diagnosis not present

## 2017-05-23 DIAGNOSIS — E291 Testicular hypofunction: Secondary | ICD-10-CM | POA: Diagnosis not present

## 2017-05-23 DIAGNOSIS — N529 Male erectile dysfunction, unspecified: Secondary | ICD-10-CM | POA: Diagnosis not present

## 2017-05-23 DIAGNOSIS — K5090 Crohn's disease, unspecified, without complications: Secondary | ICD-10-CM | POA: Diagnosis not present

## 2017-05-23 DIAGNOSIS — Z8619 Personal history of other infectious and parasitic diseases: Secondary | ICD-10-CM | POA: Diagnosis not present

## 2017-05-23 DIAGNOSIS — F419 Anxiety disorder, unspecified: Secondary | ICD-10-CM | POA: Diagnosis not present

## 2017-05-23 DIAGNOSIS — Z79899 Other long term (current) drug therapy: Secondary | ICD-10-CM | POA: Diagnosis not present

## 2017-05-23 DIAGNOSIS — R3915 Urgency of urination: Secondary | ICD-10-CM | POA: Diagnosis not present

## 2017-05-23 DIAGNOSIS — Z886 Allergy status to analgesic agent status: Secondary | ICD-10-CM | POA: Diagnosis not present

## 2017-05-23 DIAGNOSIS — R350 Frequency of micturition: Secondary | ICD-10-CM | POA: Diagnosis not present

## 2017-06-01 DIAGNOSIS — Z79899 Other long term (current) drug therapy: Secondary | ICD-10-CM | POA: Diagnosis not present

## 2017-06-01 DIAGNOSIS — I10 Essential (primary) hypertension: Secondary | ICD-10-CM | POA: Diagnosis not present

## 2017-06-01 DIAGNOSIS — G4700 Insomnia, unspecified: Secondary | ICD-10-CM | POA: Diagnosis not present

## 2017-06-01 DIAGNOSIS — G8929 Other chronic pain: Secondary | ICD-10-CM | POA: Diagnosis not present

## 2017-06-01 DIAGNOSIS — M19179 Post-traumatic osteoarthritis, unspecified ankle and foot: Secondary | ICD-10-CM | POA: Diagnosis not present

## 2017-06-14 DIAGNOSIS — Z6829 Body mass index (BMI) 29.0-29.9, adult: Secondary | ICD-10-CM | POA: Diagnosis not present

## 2017-06-14 DIAGNOSIS — F410 Panic disorder [episodic paroxysmal anxiety] without agoraphobia: Secondary | ICD-10-CM | POA: Diagnosis not present

## 2017-06-14 DIAGNOSIS — M7551 Bursitis of right shoulder: Secondary | ICD-10-CM | POA: Diagnosis not present

## 2017-06-19 DIAGNOSIS — F410 Panic disorder [episodic paroxysmal anxiety] without agoraphobia: Secondary | ICD-10-CM | POA: Diagnosis not present

## 2017-06-19 DIAGNOSIS — L270 Generalized skin eruption due to drugs and medicaments taken internally: Secondary | ICD-10-CM | POA: Diagnosis not present

## 2017-07-25 DIAGNOSIS — Z1331 Encounter for screening for depression: Secondary | ICD-10-CM | POA: Diagnosis not present

## 2017-07-25 DIAGNOSIS — Z6829 Body mass index (BMI) 29.0-29.9, adult: Secondary | ICD-10-CM | POA: Diagnosis not present

## 2017-07-25 DIAGNOSIS — F410 Panic disorder [episodic paroxysmal anxiety] without agoraphobia: Secondary | ICD-10-CM | POA: Diagnosis not present

## 2017-07-25 DIAGNOSIS — I10 Essential (primary) hypertension: Secondary | ICD-10-CM | POA: Diagnosis not present

## 2017-08-07 DIAGNOSIS — M19179 Post-traumatic osteoarthritis, unspecified ankle and foot: Secondary | ICD-10-CM | POA: Diagnosis not present

## 2017-08-07 DIAGNOSIS — I10 Essential (primary) hypertension: Secondary | ICD-10-CM | POA: Diagnosis not present

## 2017-08-07 DIAGNOSIS — G8929 Other chronic pain: Secondary | ICD-10-CM | POA: Diagnosis not present

## 2017-08-07 DIAGNOSIS — F410 Panic disorder [episodic paroxysmal anxiety] without agoraphobia: Secondary | ICD-10-CM | POA: Diagnosis not present

## 2017-08-14 DIAGNOSIS — L237 Allergic contact dermatitis due to plants, except food: Secondary | ICD-10-CM | POA: Diagnosis not present

## 2017-08-14 DIAGNOSIS — Z6829 Body mass index (BMI) 29.0-29.9, adult: Secondary | ICD-10-CM | POA: Diagnosis not present

## 2017-10-16 DIAGNOSIS — M19179 Post-traumatic osteoarthritis, unspecified ankle and foot: Secondary | ICD-10-CM | POA: Diagnosis not present

## 2017-10-16 DIAGNOSIS — I10 Essential (primary) hypertension: Secondary | ICD-10-CM | POA: Diagnosis not present

## 2017-10-16 DIAGNOSIS — G8929 Other chronic pain: Secondary | ICD-10-CM | POA: Diagnosis not present

## 2017-10-16 DIAGNOSIS — F410 Panic disorder [episodic paroxysmal anxiety] without agoraphobia: Secondary | ICD-10-CM | POA: Diagnosis not present

## 2017-11-24 DIAGNOSIS — E291 Testicular hypofunction: Secondary | ICD-10-CM | POA: Diagnosis not present

## 2017-11-24 DIAGNOSIS — N529 Male erectile dysfunction, unspecified: Secondary | ICD-10-CM | POA: Diagnosis not present

## 2017-11-24 DIAGNOSIS — N400 Enlarged prostate without lower urinary tract symptoms: Secondary | ICD-10-CM | POA: Diagnosis not present

## 2017-11-24 DIAGNOSIS — Z6826 Body mass index (BMI) 26.0-26.9, adult: Secondary | ICD-10-CM | POA: Diagnosis not present

## 2017-12-19 DIAGNOSIS — Z79899 Other long term (current) drug therapy: Secondary | ICD-10-CM | POA: Diagnosis not present

## 2017-12-19 DIAGNOSIS — M19179 Post-traumatic osteoarthritis, unspecified ankle and foot: Secondary | ICD-10-CM | POA: Diagnosis not present

## 2017-12-19 DIAGNOSIS — G8929 Other chronic pain: Secondary | ICD-10-CM | POA: Diagnosis not present

## 2017-12-19 DIAGNOSIS — G4700 Insomnia, unspecified: Secondary | ICD-10-CM | POA: Diagnosis not present

## 2017-12-19 DIAGNOSIS — F410 Panic disorder [episodic paroxysmal anxiety] without agoraphobia: Secondary | ICD-10-CM | POA: Diagnosis not present

## 2017-12-28 DIAGNOSIS — M25572 Pain in left ankle and joints of left foot: Secondary | ICD-10-CM | POA: Diagnosis not present

## 2017-12-28 DIAGNOSIS — G8929 Other chronic pain: Secondary | ICD-10-CM | POA: Diagnosis not present

## 2018-02-21 DIAGNOSIS — M19179 Post-traumatic osteoarthritis, unspecified ankle and foot: Secondary | ICD-10-CM | POA: Diagnosis not present

## 2018-02-21 DIAGNOSIS — I10 Essential (primary) hypertension: Secondary | ICD-10-CM | POA: Diagnosis not present

## 2018-02-21 DIAGNOSIS — F410 Panic disorder [episodic paroxysmal anxiety] without agoraphobia: Secondary | ICD-10-CM | POA: Diagnosis not present

## 2018-02-21 DIAGNOSIS — G8929 Other chronic pain: Secondary | ICD-10-CM | POA: Diagnosis not present

## 2018-05-07 DIAGNOSIS — F410 Panic disorder [episodic paroxysmal anxiety] without agoraphobia: Secondary | ICD-10-CM | POA: Diagnosis not present

## 2018-05-07 DIAGNOSIS — I10 Essential (primary) hypertension: Secondary | ICD-10-CM | POA: Diagnosis not present

## 2018-05-07 DIAGNOSIS — M7551 Bursitis of right shoulder: Secondary | ICD-10-CM | POA: Diagnosis not present

## 2018-05-07 DIAGNOSIS — M19179 Post-traumatic osteoarthritis, unspecified ankle and foot: Secondary | ICD-10-CM | POA: Diagnosis not present

## 2018-05-07 DIAGNOSIS — G8929 Other chronic pain: Secondary | ICD-10-CM | POA: Diagnosis not present

## 2018-05-07 DIAGNOSIS — E7800 Pure hypercholesterolemia, unspecified: Secondary | ICD-10-CM | POA: Diagnosis not present

## 2018-06-01 DIAGNOSIS — E291 Testicular hypofunction: Secondary | ICD-10-CM | POA: Diagnosis not present

## 2018-06-01 DIAGNOSIS — N529 Male erectile dysfunction, unspecified: Secondary | ICD-10-CM | POA: Diagnosis not present

## 2018-06-01 DIAGNOSIS — N400 Enlarged prostate without lower urinary tract symptoms: Secondary | ICD-10-CM | POA: Diagnosis not present

## 2018-07-12 DIAGNOSIS — F410 Panic disorder [episodic paroxysmal anxiety] without agoraphobia: Secondary | ICD-10-CM | POA: Diagnosis not present

## 2018-07-12 DIAGNOSIS — G8929 Other chronic pain: Secondary | ICD-10-CM | POA: Diagnosis not present

## 2018-07-12 DIAGNOSIS — Z79899 Other long term (current) drug therapy: Secondary | ICD-10-CM | POA: Diagnosis not present

## 2018-07-12 DIAGNOSIS — I10 Essential (primary) hypertension: Secondary | ICD-10-CM | POA: Diagnosis not present

## 2018-07-12 DIAGNOSIS — M19179 Post-traumatic osteoarthritis, unspecified ankle and foot: Secondary | ICD-10-CM | POA: Diagnosis not present

## 2018-07-20 DIAGNOSIS — M4317 Spondylolisthesis, lumbosacral region: Secondary | ICD-10-CM | POA: Diagnosis not present

## 2018-07-20 DIAGNOSIS — M5127 Other intervertebral disc displacement, lumbosacral region: Secondary | ICD-10-CM | POA: Diagnosis not present

## 2018-07-20 DIAGNOSIS — M62830 Muscle spasm of back: Secondary | ICD-10-CM | POA: Diagnosis not present

## 2018-07-20 DIAGNOSIS — M9903 Segmental and somatic dysfunction of lumbar region: Secondary | ICD-10-CM | POA: Diagnosis not present

## 2018-07-23 DIAGNOSIS — Z6830 Body mass index (BMI) 30.0-30.9, adult: Secondary | ICD-10-CM | POA: Diagnosis not present

## 2018-07-23 DIAGNOSIS — M5416 Radiculopathy, lumbar region: Secondary | ICD-10-CM | POA: Diagnosis not present

## 2018-07-27 DIAGNOSIS — G8929 Other chronic pain: Secondary | ICD-10-CM | POA: Diagnosis not present

## 2018-07-27 DIAGNOSIS — M25511 Pain in right shoulder: Secondary | ICD-10-CM | POA: Diagnosis not present

## 2018-07-27 DIAGNOSIS — S43431A Superior glenoid labrum lesion of right shoulder, initial encounter: Secondary | ICD-10-CM | POA: Diagnosis not present

## 2018-07-31 ENCOUNTER — Other Ambulatory Visit: Payer: Self-pay | Admitting: Student

## 2018-07-31 DIAGNOSIS — S43431A Superior glenoid labrum lesion of right shoulder, initial encounter: Secondary | ICD-10-CM

## 2018-07-31 DIAGNOSIS — G8929 Other chronic pain: Secondary | ICD-10-CM

## 2018-08-07 ENCOUNTER — Ambulatory Visit: Payer: BC Managed Care – PPO | Attending: Nurse Practitioner

## 2018-08-10 DIAGNOSIS — S46011A Strain of muscle(s) and tendon(s) of the rotator cuff of right shoulder, initial encounter: Secondary | ICD-10-CM | POA: Diagnosis not present

## 2018-08-10 DIAGNOSIS — M75121 Complete rotator cuff tear or rupture of right shoulder, not specified as traumatic: Secondary | ICD-10-CM | POA: Diagnosis not present

## 2018-08-10 DIAGNOSIS — S43421A Sprain of right rotator cuff capsule, initial encounter: Secondary | ICD-10-CM | POA: Diagnosis not present

## 2018-08-10 DIAGNOSIS — S46111A Strain of muscle, fascia and tendon of long head of biceps, right arm, initial encounter: Secondary | ICD-10-CM | POA: Diagnosis not present

## 2018-08-10 DIAGNOSIS — M25511 Pain in right shoulder: Secondary | ICD-10-CM | POA: Diagnosis not present

## 2018-08-14 ENCOUNTER — Ambulatory Visit: Payer: BC Managed Care – PPO

## 2018-08-16 ENCOUNTER — Ambulatory Visit: Payer: BC Managed Care – PPO

## 2018-08-20 DIAGNOSIS — E291 Testicular hypofunction: Secondary | ICD-10-CM | POA: Diagnosis not present

## 2018-08-21 ENCOUNTER — Ambulatory Visit: Payer: BC Managed Care – PPO

## 2018-08-23 ENCOUNTER — Other Ambulatory Visit: Payer: Self-pay

## 2018-08-23 DIAGNOSIS — Z20822 Contact with and (suspected) exposure to covid-19: Secondary | ICD-10-CM

## 2018-08-25 LAB — NOVEL CORONAVIRUS, NAA: SARS-CoV-2, NAA: NOT DETECTED

## 2018-08-25 LAB — SPECIMEN STATUS REPORT

## 2018-09-04 DIAGNOSIS — Z1331 Encounter for screening for depression: Secondary | ICD-10-CM | POA: Diagnosis not present

## 2018-09-04 DIAGNOSIS — M19179 Post-traumatic osteoarthritis, unspecified ankle and foot: Secondary | ICD-10-CM | POA: Diagnosis not present

## 2018-09-04 DIAGNOSIS — G8929 Other chronic pain: Secondary | ICD-10-CM | POA: Diagnosis not present

## 2018-09-04 DIAGNOSIS — I10 Essential (primary) hypertension: Secondary | ICD-10-CM | POA: Diagnosis not present

## 2018-09-04 DIAGNOSIS — R7303 Prediabetes: Secondary | ICD-10-CM | POA: Diagnosis not present

## 2018-09-07 DIAGNOSIS — M75121 Complete rotator cuff tear or rupture of right shoulder, not specified as traumatic: Secondary | ICD-10-CM | POA: Diagnosis not present

## 2018-09-07 DIAGNOSIS — M7521 Bicipital tendinitis, right shoulder: Secondary | ICD-10-CM | POA: Insufficient documentation

## 2018-09-07 DIAGNOSIS — M7581 Other shoulder lesions, right shoulder: Secondary | ICD-10-CM | POA: Insufficient documentation

## 2018-09-10 ENCOUNTER — Other Ambulatory Visit: Payer: Self-pay

## 2018-09-10 DIAGNOSIS — R6889 Other general symptoms and signs: Secondary | ICD-10-CM | POA: Diagnosis not present

## 2018-09-10 DIAGNOSIS — Z20822 Contact with and (suspected) exposure to covid-19: Secondary | ICD-10-CM

## 2018-09-12 LAB — NOVEL CORONAVIRUS, NAA: SARS-CoV-2, NAA: NOT DETECTED

## 2018-10-25 ENCOUNTER — Other Ambulatory Visit: Payer: Self-pay | Admitting: Surgery

## 2018-11-09 ENCOUNTER — Other Ambulatory Visit: Payer: Self-pay

## 2018-11-09 ENCOUNTER — Encounter
Admission: RE | Admit: 2018-11-09 | Discharge: 2018-11-09 | Disposition: A | Discharge status: Home / Self Care | Payer: BC Managed Care – PPO | Source: Ambulatory Visit | Attending: Surgery | Admitting: Surgery

## 2018-11-09 NOTE — Patient Instructions (Addendum)
Your procedure is scheduled on: 11/15/2018 Thurs Report to Same Day Surgery 2nd floor medical mall [facility] Entrance-take elevator on left to 2nd floor.  Check in with surgery information desk.) To find out your arrival time please call [phone] between 1PM - 3PM on 11/14/2018 Wed  Remember: Instructions that are not followed completely may result in serious medical risk, up to and including death, or upon the discretion of your surgeon and anesthesiologist your surgery may need to be rescheduled.    _x___ 1. Do not eat food after midnight the night before your procedure. You may drink clear liquids up to 2 hours before you are scheduled to arrive at the hospital for your procedure.  Do not drink clear liquids within 2 hours of your scheduled arrival to the hospital.  Clear liquids include  --Water or Apple juice without pulp  --Clear carbohydrate beverage such as ClearFast or Gatorade  --Black Coffee or Clear Tea (No milk, no creamers, do not add anything to                  the coffee or Tea Type 1 and type 2 diabetics should only drink water.   ____Ensure clear carbohydrate drink on the way to the hospital for bariatric patients  ____Ensure clear carbohydrate drink 3 hours before surgery.   No gum chewing or hard candies.     __x__ 2. No Alcohol for 24 hours before or after surgery.   __x__3. No Smoking or e-cigarettes for 24 prior to surgery.  Do not use any chewable tobacco products for at least 6 hour prior to surgery   ____  4. Bring all medications with you on the day of surgery if instructed.    __x__ 5. Notify your doctor if there is any change in your medical condition     (cold, fever, infections).    x___6. On the morning of surgery brush your teeth with toothpaste and water.  You may rinse your mouth with mouth wash if you wish.  Do not swallow any toothpaste or mouthwash.   Do not wear jewelry, make-up, hairpins, clips or nail polish.  Do not wear lotions,  powders, or perfumes. You may wear deodorant.  Do not shave 48 hours prior to surgery. Men may shave face and neck.  Do not bring valuables to the hospital.    [facility] is not responsible for any belongings or valuables.               Contacts, dentures or bridgework may not be worn into surgery.  Leave your suitcase in the car. After surgery it may be brought to your room.  For patients admitted to the hospital, discharge time is determined by your                       treatment team.  _  Patients discharged the day of surgery will not be allowed to drive home.  You will need someone to drive you home and stay with you the night of your procedure.    Please read over the following fact sheets that you were given:   [facility] Preparing for Surgery and or MRSA Information   _x___ Take anti-hypertensive listed below, cardiac, seizure, asthma,     anti-reflux and psychiatric medicines. These include:  1. busPIRone (BUSPAR) 10 MG tablet  2.tamsulosin (FLOMAX) 0.4 MG CAPS capsule  3.  4.  5.  6.  ____Fleets enema or Magnesium Citrate  as directed.   _x___ Use CHG Soap or sage wipes as directed on instruction sheet   ____ Use inhalers on the day of surgery and bring to hospital day of surgery  ____ Stop Metformin and Janumet 2 days prior to surgery.    ____ Take 1/2 of usual insulin dose the night before surgery and none on the morning     surgery.   _x___ Follow recommendations from Cardiologist, Pulmonologist or PCP regarding          stopping Aspirin, Coumadin, Plavix ,Eliquis, Effient, or Pradaxa, and Pletal.  X____Stop Anti-inflammatories such as Advil, Aleve, Ibuprofen, Motrin, Naproxen, Naprosyn, Goodies powders or aspirin products. OK to take Tylenol and                          Celebrex.   _x___ Stop supplements until after surgery.  But may continue Vitamin D, Vitamin B,       and multivitamin.   ____ Bring C-Pap to the hospital.

## 2018-11-12 ENCOUNTER — Encounter
Admission: RE | Admit: 2018-11-12 | Discharge: 2018-11-12 | Disposition: A | Discharge status: Home / Self Care | Payer: BC Managed Care – PPO | Source: Ambulatory Visit | Attending: Surgery | Admitting: Surgery

## 2018-11-12 ENCOUNTER — Other Ambulatory Visit: Payer: BC Managed Care – PPO

## 2018-11-12 ENCOUNTER — Other Ambulatory Visit: Payer: Self-pay

## 2018-11-12 DIAGNOSIS — Z20828 Contact with and (suspected) exposure to other viral communicable diseases: Secondary | ICD-10-CM | POA: Diagnosis not present

## 2018-11-12 DIAGNOSIS — Z01818 Encounter for other preprocedural examination: Secondary | ICD-10-CM | POA: Insufficient documentation

## 2018-11-12 DIAGNOSIS — Z01810 Encounter for preprocedural cardiovascular examination: Secondary | ICD-10-CM | POA: Diagnosis not present

## 2018-11-12 LAB — CBC
HCT: 45.1 % (ref 39.0–52.0)
Hemoglobin: 15.4 g/dL (ref 13.0–17.0)
MCH: 28.1 pg (ref 26.0–34.0)
MCHC: 34.1 g/dL (ref 30.0–36.0)
MCV: 82.3 fL (ref 80.0–100.0)
Platelets: 219 10*3/uL (ref 150–400)
RBC: 5.48 MIL/uL (ref 4.22–5.81)
RDW: 11.9 % (ref 11.5–15.5)
WBC: 5 10*3/uL (ref 4.0–10.5)
nRBC: 0 % (ref 0.0–0.2)

## 2018-11-12 LAB — BASIC METABOLIC PANEL
Anion gap: 10 (ref 5–15)
BUN: 22 mg/dL — ABNORMAL HIGH (ref 6–20)
CO2: 26 mmol/L (ref 22–32)
Calcium: 9.3 mg/dL (ref 8.9–10.3)
Chloride: 103 mmol/L (ref 98–111)
Creatinine, Ser: 0.99 mg/dL (ref 0.61–1.24)
GFR calc Af Amer: >60 mL/min (ref >60)
GFR calc non Af Amer: >60 mL/min (ref >60)
Glucose, Bld: 117 mg/dL — ABNORMAL HIGH (ref 70–99)
Potassium: 3.9 mmol/L (ref 3.5–5.1)
Sodium: 139 mmol/L (ref 135–145)

## 2018-11-12 LAB — SARS CORONAVIRUS 2 (TAT 6-24 HRS): SARS Coronavirus 2: NEGATIVE

## 2018-11-15 ENCOUNTER — Ambulatory Visit: Payer: BC Managed Care – PPO | Admitting: Certified Registered Nurse Anesthetist

## 2018-11-15 ENCOUNTER — Encounter: Payer: Self-pay | Admitting: *Deleted

## 2018-11-15 ENCOUNTER — Ambulatory Visit: Payer: BC Managed Care – PPO

## 2018-11-15 ENCOUNTER — Other Ambulatory Visit: Payer: Self-pay

## 2018-11-15 ENCOUNTER — Encounter
Admission: RE | Disposition: A | Discharge status: Home / Self Care | Payer: Self-pay | Source: Home / Self Care | Attending: Surgery

## 2018-11-15 ENCOUNTER — Ambulatory Visit
Admission: RE | Admit: 2018-11-15 | Discharge: 2018-11-15 | Disposition: A | Discharge status: Home / Self Care | Payer: BC Managed Care – PPO | Attending: Surgery | Admitting: Surgery

## 2018-11-15 DIAGNOSIS — M7521 Bicipital tendinitis, right shoulder: Secondary | ICD-10-CM | POA: Insufficient documentation

## 2018-11-15 DIAGNOSIS — M75121 Complete rotator cuff tear or rupture of right shoulder, not specified as traumatic: Secondary | ICD-10-CM | POA: Insufficient documentation

## 2018-11-15 DIAGNOSIS — M65811 Other synovitis and tenosynovitis, right shoulder: Secondary | ICD-10-CM | POA: Diagnosis not present

## 2018-11-15 DIAGNOSIS — S46111A Strain of muscle, fascia and tendon of long head of biceps, right arm, initial encounter: Secondary | ICD-10-CM | POA: Diagnosis not present

## 2018-11-15 DIAGNOSIS — X58XXXA Exposure to other specified factors, initial encounter: Secondary | ICD-10-CM | POA: Insufficient documentation

## 2018-11-15 DIAGNOSIS — M7541 Impingement syndrome of right shoulder: Secondary | ICD-10-CM | POA: Diagnosis not present

## 2018-11-15 DIAGNOSIS — M7581 Other shoulder lesions, right shoulder: Secondary | ICD-10-CM | POA: Diagnosis not present

## 2018-11-15 DIAGNOSIS — M25511 Pain in right shoulder: Secondary | ICD-10-CM | POA: Diagnosis not present

## 2018-11-15 DIAGNOSIS — G8918 Other acute postprocedural pain: Secondary | ICD-10-CM | POA: Diagnosis not present

## 2018-11-15 HISTORY — PX: SHOULDER ARTHROSCOPY WITH ROTATOR CUFF REPAIR AND SUBACROMIAL DECOMPRESSION: SHX5686

## 2018-11-15 SURGERY — SHOULDER ARTHROSCOPY WITH ROTATOR CUFF REPAIR AND SUBACROMIAL DECOMPRESSION
Anesthesia: General | Site: Shoulder | Laterality: Right

## 2018-11-15 MED ORDER — CEFAZOLIN SODIUM-DEXTROSE 2-4 GM/100ML-% IV SOLN
2.0000 g | Freq: Once | INTRAVENOUS | Status: AC
  Administered 2018-11-15: 2 g via INTRAVENOUS

## 2018-11-15 MED ORDER — METOCLOPRAMIDE HCL 5 MG/ML IJ SOLN: 5.0000 mg | Freq: Three times a day (TID) | INTRAMUSCULAR | Status: DC | PRN

## 2018-11-15 MED ORDER — LIDOCAINE HCL (PF) 2 % IJ SOLN
INTRAMUSCULAR | Status: AC
  Filled 2018-11-15: qty 10

## 2018-11-15 MED ORDER — OXYCODONE HCL 5 MG PO TABS: 5.0000 mg | ORAL_TABLET | ORAL | Status: DC | PRN

## 2018-11-15 MED ORDER — ONDANSETRON HCL 4 MG/2ML IJ SOLN: 4.0000 mg | Freq: Four times a day (QID) | INTRAMUSCULAR | Status: DC | PRN

## 2018-11-15 MED ORDER — BUPIVACAINE-EPINEPHRINE 0.5% -1:200000 IJ SOLN
INTRAMUSCULAR | Status: DC | PRN
  Administered 2018-11-15: 30 mL

## 2018-11-15 MED ORDER — SUGAMMADEX SODIUM 200 MG/2ML IV SOLN
INTRAVENOUS | Status: DC | PRN
  Administered 2018-11-15: 199.6 mg via INTRAVENOUS

## 2018-11-15 MED ORDER — POTASSIUM CHLORIDE IN NACL 20-0.9 MEQ/L-% IV SOLN: INTRAVENOUS | Status: DC

## 2018-11-15 MED ORDER — FAMOTIDINE 20 MG PO TABS
20.0000 mg | ORAL_TABLET | Freq: Once | ORAL | Status: AC
  Administered 2018-11-15: 20 mg via ORAL

## 2018-11-15 MED ORDER — FENTANYL CITRATE (PF) 100 MCG/2ML IJ SOLN
25.0000 ug | Freq: Once | INTRAMUSCULAR | Status: AC
  Administered 2018-11-15: 25 ug via INTRAVENOUS

## 2018-11-15 MED ORDER — PHENYLEPHRINE HCL (PRESSORS) 10 MG/ML IV SOLN
INTRAVENOUS | Status: DC | PRN
  Administered 2018-11-15 (×2): 100 ug via INTRAVENOUS
  Administered 2018-11-15 (×2): 200 ug via INTRAVENOUS

## 2018-11-15 MED ORDER — MIDAZOLAM HCL 2 MG/2ML IJ SOLN
2.0000 mg | Freq: Once | INTRAMUSCULAR | Status: AC
  Administered 2018-11-15: 2 mg via INTRAVENOUS

## 2018-11-15 MED ORDER — ONDANSETRON HCL 4 MG PO TABS: 4.0000 mg | ORAL_TABLET | Freq: Four times a day (QID) | ORAL | Status: DC | PRN

## 2018-11-15 MED ORDER — BUPIVACAINE HCL (PF) 0.5 % IJ SOLN
INTRAMUSCULAR | Status: AC
  Filled 2018-11-15: qty 30

## 2018-11-15 MED ORDER — FENTANYL CITRATE (PF) 100 MCG/2ML IJ SOLN
50.0000 ug | Freq: Once | INTRAMUSCULAR | Status: AC
  Administered 2018-11-15: 50 ug via INTRAVENOUS

## 2018-11-15 MED ORDER — LACTATED RINGERS IV SOLN
INTRAVENOUS | Status: DC
  Administered 2018-11-15 (×2): via INTRAVENOUS

## 2018-11-15 MED ORDER — ROCURONIUM BROMIDE 50 MG/5ML IV SOLN
INTRAVENOUS | Status: AC
  Filled 2018-11-15: qty 1

## 2018-11-15 MED ORDER — ROCURONIUM BROMIDE 50 MG/5ML IV SOLN
INTRAVENOUS | Status: AC
  Filled 2018-11-15: qty 2

## 2018-11-15 MED ORDER — MIDAZOLAM HCL 2 MG/2ML IJ SOLN
INTRAMUSCULAR | Status: AC
  Filled 2018-11-15: qty 2

## 2018-11-15 MED ORDER — METOCLOPRAMIDE HCL 10 MG PO TABS: 5.0000 mg | ORAL_TABLET | Freq: Three times a day (TID) | ORAL | Status: DC | PRN

## 2018-11-15 MED ORDER — EPINEPHRINE PF 1 MG/ML IJ SOLN
INTRAMUSCULAR | Status: AC
  Filled 2018-11-15: qty 1

## 2018-11-15 MED ORDER — PROPOFOL 10 MG/ML IV BOLUS
INTRAVENOUS | Status: AC
  Filled 2018-11-15: qty 20

## 2018-11-15 MED ORDER — FENTANYL CITRATE (PF) 100 MCG/2ML IJ SOLN
INTRAMUSCULAR | Status: DC | PRN
  Administered 2018-11-15: 50 ug via INTRAVENOUS

## 2018-11-15 MED ORDER — ACETAMINOPHEN 10 MG/ML IV SOLN
INTRAVENOUS | Status: DC | PRN
  Administered 2018-11-15: 1000 mg via INTRAVENOUS

## 2018-11-15 MED ORDER — FENTANYL CITRATE (PF) 100 MCG/2ML IJ SOLN: 25.0000 ug | INTRAMUSCULAR | Status: DC | PRN

## 2018-11-15 MED ORDER — ACETAMINOPHEN 10 MG/ML IV SOLN
INTRAVENOUS | Status: AC
  Filled 2018-11-15: qty 100

## 2018-11-15 MED ORDER — CEFAZOLIN SODIUM 1 G IJ SOLR
INTRAMUSCULAR | Status: AC
  Filled 2018-11-15: qty 20

## 2018-11-15 MED ORDER — ONDANSETRON HCL 4 MG/2ML IJ SOLN
INTRAMUSCULAR | Status: DC | PRN
  Administered 2018-11-15: 4 mg via INTRAVENOUS

## 2018-11-15 MED ORDER — OXYCODONE HCL 5 MG PO TABS: 5.0000 mg | ORAL_TABLET | ORAL | 0 refills | Status: DC | PRN

## 2018-11-15 MED ORDER — CEFAZOLIN SODIUM-DEXTROSE 2-4 GM/100ML-% IV SOLN
INTRAVENOUS | Status: AC
  Filled 2018-11-15: qty 100

## 2018-11-15 MED ORDER — LIDOCAINE HCL (CARDIAC) PF 100 MG/5ML IV SOSY
PREFILLED_SYRINGE | INTRAVENOUS | Status: DC | PRN
  Administered 2018-11-15: 80 mg via INTRAVENOUS

## 2018-11-15 MED ORDER — BUPIVACAINE HCL (PF) 0.5 % IJ SOLN
INTRAMUSCULAR | Status: DC | PRN
  Administered 2018-11-15: 10 mL via PERINEURAL

## 2018-11-15 MED ORDER — BUPIVACAINE LIPOSOME 1.3 % IJ SUSP
INTRAMUSCULAR | Status: DC | PRN
  Administered 2018-11-15: 20 mL via PERINEURAL

## 2018-11-15 MED ORDER — PROPOFOL 10 MG/ML IV BOLUS
INTRAVENOUS | Status: DC | PRN
  Administered 2018-11-15: 200 mg via INTRAVENOUS

## 2018-11-15 MED ORDER — BUPIVACAINE LIPOSOME 1.3 % IJ SUSP
INTRAMUSCULAR | Status: AC
  Filled 2018-11-15: qty 20

## 2018-11-15 MED ORDER — FENTANYL CITRATE (PF) 100 MCG/2ML IJ SOLN
INTRAMUSCULAR | Status: AC
  Administered 2018-11-15: 25 ug
  Filled 2018-11-15: qty 2

## 2018-11-15 MED ORDER — ONDANSETRON HCL 4 MG/2ML IJ SOLN
INTRAMUSCULAR | Status: AC
  Filled 2018-11-15: qty 2

## 2018-11-15 MED ORDER — SUGAMMADEX SODIUM 200 MG/2ML IV SOLN
INTRAVENOUS | Status: AC
  Filled 2018-11-15: qty 2

## 2018-11-15 MED ORDER — EPHEDRINE SULFATE 50 MG/ML IJ SOLN
INTRAMUSCULAR | Status: DC | PRN
  Administered 2018-11-15 (×4): 10 mg via INTRAVENOUS

## 2018-11-15 MED ORDER — ONDANSETRON HCL 4 MG/2ML IJ SOLN
4.0000 mg | Freq: Once | INTRAMUSCULAR | Status: AC | PRN
  Administered 2018-11-15: 4 mg via INTRAVENOUS

## 2018-11-15 MED ORDER — LIDOCAINE HCL (PF) 1 % IJ SOLN
INTRAMUSCULAR | Status: AC
  Filled 2018-11-15: qty 5

## 2018-11-15 MED ORDER — BUPIVACAINE HCL (PF) 0.5 % IJ SOLN
INTRAMUSCULAR | Status: AC
  Filled 2018-11-15: qty 10

## 2018-11-15 MED ORDER — FENTANYL CITRATE (PF) 100 MCG/2ML IJ SOLN
INTRAMUSCULAR | Status: AC
  Filled 2018-11-15: qty 2

## 2018-11-15 MED ORDER — LIDOCAINE HCL (PF) 1 % IJ SOLN
INTRAMUSCULAR | Status: DC | PRN
  Administered 2018-11-15: 3 mL via INTRADERMAL

## 2018-11-15 MED ORDER — ROCURONIUM BROMIDE 100 MG/10ML IV SOLN
INTRAVENOUS | Status: DC | PRN
  Administered 2018-11-15: 50 mg via INTRAVENOUS
  Administered 2018-11-15: 20 mg via INTRAVENOUS

## 2018-11-15 MED ORDER — SODIUM CHLORIDE 0.9 % IV SOLN
INTRAVENOUS | Status: DC | PRN
  Administered 2018-11-15: 30 ug/min via INTRAVENOUS

## 2018-11-15 MED ORDER — DEXAMETHASONE SODIUM PHOSPHATE 10 MG/ML IJ SOLN
INTRAMUSCULAR | Status: DC | PRN
  Administered 2018-11-15: 8 mg via INTRAVENOUS

## 2018-11-15 MED ORDER — MIDAZOLAM HCL 2 MG/2ML IJ SOLN
INTRAMUSCULAR | Status: DC | PRN
  Administered 2018-11-15: 2 mg via INTRAVENOUS

## 2018-11-15 MED ORDER — DEXAMETHASONE SODIUM PHOSPHATE 10 MG/ML IJ SOLN
INTRAMUSCULAR | Status: AC
  Filled 2018-11-15: qty 1

## 2018-11-15 MED ORDER — FAMOTIDINE 20 MG PO TABS
ORAL_TABLET | ORAL | Status: AC
  Filled 2018-11-15: qty 1

## 2018-11-15 MED ORDER — EPHEDRINE SULFATE 50 MG/ML IJ SOLN
INTRAMUSCULAR | Status: AC
  Filled 2018-11-15: qty 1

## 2018-11-15 SURGICAL SUPPLY — 47 items
ANCHOR JUGGERKNOT WTAP NDL 2.9 (Anchor) ×1 IMPLANT
BIT DRILL JUGRKNT W/NDL BIT2.9 (DRILL) IMPLANT
BLADE FULL RADIUS 3.5 (BLADE) ×2 IMPLANT
BUR ACROMIONIZER 4.0 (BURR) ×2 IMPLANT
CANNULA SHAVER 8MMX76MM (CANNULA) ×2 IMPLANT
CHLORAPREP W/TINT 26 (MISCELLANEOUS) ×2 IMPLANT
COVER MAYO STAND REUSABLE (DRAPES) ×2 IMPLANT
COVER WAND RF STERILE (DRAPES) ×2 IMPLANT
DRAPE IMP U-DRAPE 54X76 (DRAPES) ×4 IMPLANT
DRAPE SPLIT 6X30 W/TAPE (DRAPES) ×4 IMPLANT
DRILL JUGGERKNOT W/NDL BIT 2.9 (DRILL) ×2
ELECT CAUTERY BLADE TIP 2.5 (TIP) ×2
ELECT REM PT RETURN 9FT ADLT (ELECTROSURGICAL) ×2
ELECTRODE CAUTERY BLDE TIP 2.5 (TIP) ×1 IMPLANT
ELECTRODE REM PT RTRN 9FT ADLT (ELECTROSURGICAL) ×1 IMPLANT
GAUZE SPONGE 4X4 12PLY STRL (GAUZE/BANDAGES/DRESSINGS) ×2 IMPLANT
GAUZE XEROFORM 1X8 LF (GAUZE/BANDAGES/DRESSINGS) ×2 IMPLANT
GLOVE BIO SURGEON STRL SZ7.5 (GLOVE) ×4 IMPLANT
GLOVE BIO SURGEON STRL SZ8 (GLOVE) ×4 IMPLANT
GLOVE BIOGEL PI IND STRL 8 (GLOVE) ×1 IMPLANT
GLOVE BIOGEL PI INDICATOR 8 (GLOVE) ×1
GLOVE INDICATOR 8.0 STRL GRN (GLOVE) ×2 IMPLANT
GOWN STRL REUS W/ TWL LRG LVL3 (GOWN DISPOSABLE) ×1 IMPLANT
GOWN STRL REUS W/ TWL XL LVL3 (GOWN DISPOSABLE) ×1 IMPLANT
GOWN STRL REUS W/TWL LRG LVL3 (GOWN DISPOSABLE) ×1
GOWN STRL REUS W/TWL XL LVL3 (GOWN DISPOSABLE) ×1
GRASPER SUT 15 45D LOW PRO (SUTURE) IMPLANT
IV LACTATED RINGER IRRG 3000ML (IV SOLUTION) ×2
IV LR IRRIG 3000ML ARTHROMATIC (IV SOLUTION) ×2 IMPLANT
MANIFOLD NEPTUNE II (INSTRUMENTS) ×2 IMPLANT
MASK FACE SPIDER DISP (MASK) ×2 IMPLANT
MAT ABSORB  FLUID 56X50 GRAY (MISCELLANEOUS) ×1
MAT ABSORB FLUID 56X50 GRAY (MISCELLANEOUS) ×1 IMPLANT
PACK ARTHROSCOPY SHOULDER (MISCELLANEOUS) ×2 IMPLANT
PASSER SUT FIRSTPASS SELF (INSTRUMENTS) ×1 IMPLANT
SLING ARM LRG DEEP (SOFTGOODS) ×1 IMPLANT
SLING ULTRA II LG (MISCELLANEOUS) ×2 IMPLANT
STAPLER SKIN PROX 35W (STAPLE) ×2 IMPLANT
STRAP SAFETY 5IN WIDE (MISCELLANEOUS) ×2 IMPLANT
SUT ETHIBOND 0 MO6 C/R (SUTURE) ×2 IMPLANT
SUT ULTRABRAID 2 COBRAID 38 (SUTURE) IMPLANT
SUT VIC AB 2-0 CT1 27 (SUTURE) ×2
SUT VIC AB 2-0 CT1 TAPERPNT 27 (SUTURE) ×2 IMPLANT
TAPE MICROFOAM 4IN (TAPE) ×2 IMPLANT
TUBING ARTHRO INFLOW-ONLY STRL (TUBING) ×2 IMPLANT
TUBING CONNECTING 10 (TUBING) ×2 IMPLANT
WAND WEREWOLF FLOW 90D (MISCELLANEOUS) ×2 IMPLANT

## 2018-11-15 NOTE — Anesthesia Procedure Notes (Addendum)
Anesthesia Regional Block: Interscalene brachial plexus block   Pre-Anesthetic Checklist: ,, timeout performed, Correct Patient, Correct Site, Correct Laterality, Correct Procedure, Correct Position, site marked, Risks and benefits discussed,  Surgical consent,  Pre-op evaluation,  At surgeon's request and post-op pain management  Laterality: Right  Prep: chloraprep       Needles:  Injection technique: Single-shot  Needle Type: Echogenic Stimulator Needle     Needle Length: 10cm  Needle Gauge: 20     Additional Needles:   Procedures:, nerve stimulator,,, ultrasound used (permanent image in chart),,,,   Nerve Stimulator or Paresthesia:  Response: biceps flexion,   Additional Responses:   Narrative:  Start time: 11/15/2018 11:08 AM End time: 11/15/2018 11:18 AM Injection made incrementally with aspirations every 5 mL.  Performed by: Personally   Additional Notes: Functioning IV was confirmed and monitors were applied. Sterile prep and drape,hand hygiene and sterile gloves were used.  Negative aspiration and negative test dose prior to incremental administration of local anesthetic. The patient tolerated the procedure well.He did experience some transient burning upon initial injection with spontaneous resolution.

## 2018-11-15 NOTE — Discharge Instructions (Addendum)
Interscalene Nerve Block with Exparel  1.  For your surgery you have received an Interscalene Nerve Block with Exparel. 2. Nerve Blocks affect many types of nerves, including nerves that control movement, pain and normal sensation.  You may experience feelings such as numbness, tingling, heaviness, weakness or the inability to move your arm or the feeling or sensation that your arm has "fallen asleep". 3. A nerve block with Exparel can last up to 5 days.  Usually the weakness wears off first.  The tingling and heaviness usually wear off next.  Finally you may start to notice pain.  Keep in mind that this may occur in any order.  Once a nerve block starts to wear off it is usually completely gone within 60 minutes. 4. ISNB may cause mild shortness of breath, a hoarse voice, blurry vision, unequal pupils, or drooping of the face on the same side as the nerve block.  These symptoms will usually resolve with the numbness.  Very rarely the procedure itself can cause mild seizures. 5. If needed, your surgeon will give you a prescription for pain medication.  It will take about 60 minutes for the oral pain medication to become fully effective.  So, it is recommended that you start taking this medication before the nerve block first begins to wear off, or when you first begin to feel discomfort. 6. Take your pain medication only as prescribed.  Pain medication can cause sedation and decrease your breathing if you take more than you need for the level of pain that you have. 7. Nausea is a common side effect of many pain medications.  You may want to eat something before taking your pain medicine to prevent nausea. 8. After an Interscalene nerve block, you cannot feel pain, pressure or extremes in temperature in the effected arm.  Because your arm is numb it is at an increased risk for injury.  To decrease the possibility of injury, please practice the following:  a. While you are awake change the position of  your arm frequently to prevent too much pressure on any one area for prolonged periods of time. b.  If you have a cast or tight dressing, check the color or your fingers every couple of hours.  Call your surgeon with the appearance of any discoloration (white or blue). c. If you are given a sling to wear before you go home, please wear it  at all times until the block has completely worn off.  Do not get up at night without your sling. d. Please contact [facility] or your surgeon if you do not begin to regain sensation after 7 days from the surgery.  Anesthesia may be contacted by calling the Same Day Surgery Department, Mon. through Fri., 6 am to 4 pm at [phone].   e. If you experience any other problems or concerns, please contact your surgeon's office. If you experience severe or prolonged shortness of breath go to the nearest emergency department.     AMBULATORY SURGERY  DISCHARGE INSTRUCTIONS   1) The drugs that you were given will stay in your system until tomorrow so for the next 24 hours you should not:  A) Drive an automobile B) Make any legal decisions C) Drink any alcoholic beverage   2) You may resume regular meals tomorrow.  Today it is better to start with liquids and gradually work up to solid foods.  You may eat anything you prefer, but it is better to start with liquids, then  soup and crackers, and gradually work up to solid foods.   3) Please notify your doctor immediately if you have any unusual bleeding, trouble breathing, redness and pain at the surgery site, drainage, fever, or pain not relieved by medication. 4)   5) Your post-operative visit with Dr.                                     is: Date:                        Time:    Please call to schedule your post-operative visit.  6) Additional Instructions:      Orthopedic discharge instructions: Keep dressing dry and intact.  May shower after dressing changed on post-op day #4 (Monday).    Cover staples with Band-Aids after drying off. Apply ice frequently to shoulder. Take ibuprofen 800 mg TID with meals for 7-10 days, then as necessary. Take oxycodone as prescribed when needed.  May supplement with ES Tylenol if necessary. Keep shoulder immobilizer on at all times except may remove for bathing purposes. Follow-up in 10-14 days or as scheduled.

## 2018-11-15 NOTE — Anesthesia Postprocedure Evaluation (Signed)
Anesthesia Post Note  Patient: Darryl Roy  Procedure(s) Performed: SHOULDER ARTHROSCOPY WITH DEBRIDEMENT, DECOMPRESSION, AND MASSIVE ROTATOR CUFF REPAIR. (Right Shoulder)  Patient location during evaluation: PACU Anesthesia Type: General Level of consciousness: awake and alert and oriented Pain management: pain level controlled Vital Signs Assessment: post-procedure vital signs reviewed and stable Respiratory status: spontaneous breathing Cardiovascular status: blood pressure returned to baseline Anesthetic complications: no     Last Vitals:  Vitals:   11/15/18 1545 11/15/18 1553  BP: 127/80 128/73  Pulse: 99 (!) 101  Resp: 20 16  Temp: (!) 36.2 C (!) 36.2 C  SpO2: 93% 100%    Last Pain:  Vitals:   11/15/18 1553  TempSrc: Temporal  PainSc: 0-No pain                 Jahari Wiginton

## 2018-11-15 NOTE — Consult Note (Signed)
Pharmacy Antibiotic Note  Darryl Roy is a 48 y.o. male admitted on (Not on file) with surgical prophylaxis.  Pharmacy has been consulted for Cefazolin dosing.  Plan: Cefazolin 2 g x1 dose, to be given 30 minutes prior to procedure. Orders will be signed and held.     No data recorded.  Recent Labs  Lab 11/12/18 0902  WBC 5.0  CREATININE 0.99    Estimated Creatinine Clearance: 115.3 mL/min (by C-G formula based on SCr of 0.99 mg/dL).    Allergies  Allergen Reactions  . Ibuprofen     Upsets stomach     Thank you for allowing pharmacy to be a part of this patient's care.  Rowland Lathe 11/15/2018 10:02 AM

## 2018-11-15 NOTE — Anesthesia Procedure Notes (Signed)
Procedure Name: Intubation Date/Time: 11/15/2018 1:14 PM Performed by: Lowry Bowl, CRNA Pre-anesthesia Checklist: Patient identified, Emergency Drugs available, Suction available and Patient being monitored Patient Re-evaluated:Patient Re-evaluated prior to induction Oxygen Delivery Method: Circle system utilized Preoxygenation: Pre-oxygenation with 100% oxygen Induction Type: IV induction and Cricoid Pressure applied Ventilation: Oral airway inserted - appropriate to patient size Laryngoscope Size: Mac and 4 Grade View: Grade I Tube type: Oral Tube size: 7.5 mm Number of attempts: 1 Airway Equipment and Method: Stylet Placement Confirmation: ETT inserted through vocal cords under direct vision,  positive ETCO2 and breath sounds checked- equal and bilateral Secured at: 23 cm Tube secured with: Tape Dental Injury: Teeth and Oropharynx as per pre-operative assessment

## 2018-11-15 NOTE — H&P (Signed)
Paper H&P to be scanned into permanent record. H&P reviewed and patient re-examined. No changes. 

## 2018-11-15 NOTE — Transfer of Care (Signed)
Immediate Anesthesia Transfer of Care Note  Patient: Darryl Roy  Procedure(s) Performed: SHOULDER ARTHROSCOPY WITH DEBRIDEMENT, DECOMPRESSION, AND MASSIVE ROTATOR CUFF REPAIR. (Right Shoulder)  Patient Location: PACU  Anesthesia Type:General  Level of Consciousness: awake, oriented and drowsy  Airway & Oxygen Therapy: Patient Spontanous Breathing and Patient connected to face mask oxygen  Post-op Assessment: Report given to RN and Post -op Vital signs reviewed and stable  Post vital signs: Reviewed and stable  Last Vitals:  Vitals Value Taken Time  BP 137/84 11/15/18 1515  Temp 36.5 C 11/15/18 1515  Pulse 106 11/15/18 1517  Resp 25 11/15/18 1517  SpO2 98 % 11/15/18 1517  Vitals shown include unvalidated device data.  Last Pain:  Vitals:   11/15/18 1023  TempSrc: Tympanic  PainSc: 5          Complications: No apparent anesthesia complications

## 2018-11-15 NOTE — Op Note (Signed)
11/15/2018  3:16 PM  Patient:   Darryl Roy  Pre-Op Diagnosis:   Massive rotator cuff tear with biceps tendinopathy, right shoulder.  Post-Op Diagnosis:   Massive irreparable rotator cuff tear with biceps tendinopathy, right shoulder.  Procedure:   Extensive arthroscopic debridement, arthroscopic subacromial decompression, and mini-open biceps tenodesis, right shoulder.  Anesthesia:   General endotracheal with interscalene block using Exparel placed preoperatively by the anesthesiologist.  Surgeon:   Pascal Lux, MD  Assistant:   Cameron Proud, PA-C; Erin Mecum, PA-S  Findings:   As above.  There was a massive rotator cuff tear involving the entire supraspinatus, entire infraspinatus, and superior portion of the teres minor with retraction back to the glenoid.  There was some fraying of the superior insertional fibers of the subscapularis tendon without obvious disruption of the footprint.  There were significant tendinopathic changes of the biceps tendon with partial thickness tearing.  The labrum was in satisfactory condition, as were the articular surfaces of the glenoid and humerus.  Complications:   None  Fluids:   1300 cc  Estimated blood loss:   25 cc  Tourniquet time:   None  Drains:   None  Closure:   Staples      Brief clinical note:   The patient is a 48 year old male with a long history of right shoulder pain.  His symptoms worsened substantially about 4 months ago when he felt a pop in his shoulder lifting some 2x4's. Following this event, he was unable to lift his arm above shoulder level. The patient's symptoms have progressed despite medications, activity modification, etc. The patient's history and examination are consistent with a massive rotator cuff tear. These findings were confirmed by MRI scan. The patient presents at this time for a right shoulder arthroscopy with debridement, decompression, and attempted repair of his massive rotator cuff  tear.  Procedure:   The patient underwent placement of an interscalene block using Exparel by the anesthesiologist in the preoperative holding area before being brought into the operating room and lain in the supine position. The patient then underwent general endotracheal intubation and anesthesia before being repositioned in the beach chair position using the beach chair positioner. The right shoulder and upper extremity were prepped with ChloraPrep solution before being draped sterilely. Preoperative antibiotics were administered. A timeout was performed to confirm the proper surgical site before the expected portal sites and incision site were injected with 0.5% Sensorcaine with epinephrine. A posterior portal was created and the glenohumeral joint thoroughly inspected with the findings as described above. An anterior portal was created using an outside-in technique. The labrum and rotator cuff were further probed, again confirming the above-noted findings. Areas of synovitis were debrided back to stable margins using the full-radius resector, as were some of the frayed margins of the rotator cuff tears. The ArthroCare wand was inserted and used to release the biceps tendon from its labral anchor. It also was used to obtain hemostasis as well as to "anneal" the labrum superiorly and anteriorly. The instruments were removed from the joint after suctioning the excess fluid.  The camera was repositioned through the posterior portal into the subacromial space. A separate lateral portal was created using an outside-in technique. The 3.5 mm full-radius resector was introduced and used to perform a subtotal bursectomy. The ArthroCare wand was then inserted and used to remove the periosteal tissue off the undersurface of the anterior third of the acromion as well as to recess the coracoacromial ligament from its  attachment along the anterior and lateral margins of the acromion. The 4.0 mm acromionizing bur was  introduced and used to complete the decompression by removing the undersurface of the anterior third of the acromion. The full radius resector was reintroduced to remove any residual bony debris before the ArthroCare wand was reintroduced to obtain hemostasis. The instruments were then removed from the subacromial space after suctioning the excess fluid.  An approximately 4-5 cm incision was made over the anterolateral aspect of the shoulder beginning at the anterolateral corner of the acromion and extending distally in line with the bicipital groove. This incision was carried down through the subcutaneous tissues to expose the deltoid fascia. The raphae between the anterior and middle thirds was identified and this plane developed to provide access into the subacromial space. Additional bursal tissues were debrided sharply using Metzenbaum scissors. The rotator cuff tear was readily identified. The superior border of the subscapularis tendon could be identified, but the remaining portions of the torn rotator cuff had retracted back beyond the glenoid rim.  Despite attempts extensive efforts at identifying the cuff margin and trying to release adhesions to permit mobilization, the medial portion of the tear could not be mobilized sufficiently to effect the repair.  Furthermore, the posterior portion of the tear could not be identified satisfactorily to permit repair or even to enable contemplation of a superior capsule reconstruction.  The bicipital groove was identified by palpation and opened for 1-1.5 cm. The biceps tendon stump was retrieved through this defect. The floor of the bicipital groove was roughened with a curet before a single Biomet 2.9 mm JuggerKnot anchor was inserted. Both sets of sutures were passed through the biceps tendon and tied securely to effect the tenodesis. The bicipital sheath was reapproximated using two #0 Ethibond interrupted sutures, incorporating the biceps tendon to further  reinforce the tenodesis.  The wound was copiously irrigated with sterile saline solution before the deltoid raphae was reapproximated using 2-0 Vicryl interrupted sutures. The subcutaneous tissues were closed in two layers using 2-0 Vicryl interrupted sutures before the skin was closed using staples. The portal sites also were closed using staples. A sterile bulky dressing was applied to the shoulder before the arm was placed into a shoulder immobilizer. The patient was then awakened, extubated, and returned to the recovery room in satisfactory condition after tolerating the procedure well.

## 2018-11-15 NOTE — Anesthesia Preprocedure Evaluation (Signed)
Anesthesia Evaluation  Patient identified by MRN, date of birth, ID band Patient awake    Reviewed: Allergy & Precautions, H&P , NPO status , Patient's Chart, lab work & pertinent test results, reviewed documented beta blocker date and time   Airway Mallampati: II  TM Distance: >3 FB Neck ROM: full    Dental  (+) Teeth Intact   Pulmonary neg pulmonary ROS,    Pulmonary exam normal        Cardiovascular Exercise Tolerance: Good negative cardio ROS Normal cardiovascular exam Rhythm:regular Rate:Normal     Neuro/Psych negative neurological ROS  negative psych ROS   GI/Hepatic negative GI ROS, Neg liver ROS,   Endo/Other  negative endocrine ROS  Renal/GU negative Renal ROS  negative genitourinary   Musculoskeletal   Abdominal   Peds  Hematology negative hematology ROS (+)   Anesthesia Other Findings History reviewed. No pertinent past medical history. Past Surgical History: No date: ANKLE ARTHROSCOPY W/ OPEN REPAIR; Left No date: VASECTOMY No date: VASECTOMY REVERSAL BMI    Body Mass Index: 29.84 kg/m     Reproductive/Obstetrics negative OB ROS                             Anesthesia Physical Anesthesia Plan  ASA: II  Anesthesia Plan: General ETT   Post-op Pain Management:  Regional for Post-op pain   Induction:   PONV Risk Score and Plan:   Airway Management Planned: Oral ETT  Additional Equipment:   Intra-op Plan:   Post-operative Plan:   Informed Consent: I have reviewed the patients History and Physical, chart, labs and discussed the procedure including the risks, benefits and alternatives for the proposed anesthesia with the patient or authorized representative who has indicated his/her understanding and acceptance.     Dental Advisory Given  Plan Discussed with: CRNA  Anesthesia Plan Comments:         Anesthesia Quick Evaluation

## 2018-11-15 NOTE — Anesthesia Post-op Follow-up Note (Signed)
Anesthesia QCDR form completed.        

## 2018-11-16 NOTE — Progress Notes (Signed)
Patient has stomach pain sounds like gas.

## 2018-11-19 ENCOUNTER — Encounter: Payer: Self-pay | Admitting: Surgery

## 2018-11-20 DIAGNOSIS — Z9889 Other specified postprocedural states: Secondary | ICD-10-CM | POA: Diagnosis not present

## 2018-11-20 DIAGNOSIS — M6281 Muscle weakness (generalized): Secondary | ICD-10-CM | POA: Diagnosis not present

## 2018-11-20 DIAGNOSIS — M25611 Stiffness of right shoulder, not elsewhere classified: Secondary | ICD-10-CM | POA: Diagnosis not present

## 2018-11-20 DIAGNOSIS — M25511 Pain in right shoulder: Secondary | ICD-10-CM | POA: Diagnosis not present

## 2018-11-20 NOTE — H&P (Signed)
Subjective:  Chief complaint: Right shoulder pain and weakness.  The patient is a 48 y.o. male with a several year history of intermittent shoulder pain for which he has been receiving steroid injections by his primary care provider.  Several months ago, while lifting some 2 by fours at work, he felt a painful pop in his shoulder.  Since then, his symptoms have worsened.  He has difficulty raising his arm even to shoulder level.  He has pain at night, as well as with normal daily activities and any attempted overhead activities.  His symptoms have persisted despite medications, activity modification, etc. his history and examination are consistent with a large rotator cuff tear confirmed by MRI scan.  The patient presents at this time for a right shoulder arthroscopy with debridement, decompression, and an attempted rotator cuff repair.  There are no active problems to display for this patient.  History reviewed. No pertinent past medical history.  Past Surgical History:  Procedure Laterality Date  . ANKLE ARTHROSCOPY W/ OPEN REPAIR Left   . SHOULDER ARTHROSCOPY WITH ROTATOR CUFF REPAIR AND SUBACROMIAL DECOMPRESSION Right 11/15/2018   Procedure: SHOULDER ARTHROSCOPY WITH DEBRIDEMENT, DECOMPRESSION, AND MASSIVE ROTATOR CUFF REPAIR.;  Surgeon: Corky Mull, MD;  Location: [facility] ORS;  Service: Orthopedics;  Laterality: Right;  Marland Kitchen VASECTOMY    . VASECTOMY REVERSAL      No medications prior to admission.   Allergies  Allergen Reactions  . Ibuprofen     Upsets stomach     Social History   Tobacco Use  . Smoking status: Never Smoker  . Smokeless tobacco: Never Used  Substance Use Topics  . Alcohol use: Never    Frequency: Never    History reviewed. No pertinent family history.   Review of Systems: As noted above. The patient denies any chest pain, shortness of breath, nausea, vomiting, diarrhea, constipation, belly pain, blood in his/her stool, or burning with  urination.  Objective:  Physical Exam: General:  Alert, no acute distress Psychiatric:  Patient is competent for consent with normal mood and affect Cardiovascular:  RRR  Respiratory:  Clear to auscultation. No wheezing. Non-labored breathing GI:  Abdomen is soft and non-tender Skin:  No lesions in the area of chief complaint Neurologic:  Sensation intact distally Lymphatic:  No axillary or cervical lymphadenopathy  Right shoulder exam:  SKIN: normal SWELLING: none WARMTH: none LYMPH NODES: no adenopathy palpable CREPITUS: none TENDERNESS: Mildly tender over anterolateral shoulder ROM (active):  Forward flexion: 80 degrees Abduction: 85 degrees Internal rotation: L2 ROM (passive):  Forward flexion: 150 degrees Abduction: 140 degrees ER/IR at 90 abd: 85 degrees / 45 degrees  He describes moderate pain with all motions.  STRENGTH:  Forward flexion: 3/5   Abduction: 3/5   External rotation: 3-3+/5   Internal rotation: 4/5   Pain with RC testing: He describes marked pain with attempted resisted forward flexion and abduction, and mild-moderate pain with resisted internal and external rotation.  STABILITY: Normal  SPECIAL TESTS:  Luan Pulling' test: positive, moderate    Speed's test: positive    Capsulitis - pain w/ passive ER: no    Crossed arm test: Mildly positive    Crank: Not evaluated    Anterior apprehension: Negative    Posterior apprehension: Not evaluated  He is neurovascularly intact to the right upper extremity.  Imaging Review: A recent MRI scan of the right shoulder has been obtained and is available for review. By report, the scan demonstrates evidence of a massive rotator cuff  tear involving the supraspinatus, infraspinatus, and superior portion of the teres minor tendons as well as the superior portion of the subscapularis tendon. There also is evidence of biceps tendinopathy, but no cartilage abnormality.  Assessment: Massive rotator cuff tear, right  shoulder.  Plan: The treatment options, including both surgical and nonsurgical choices, have been discussed in detail with the patient. The risks (including bleeding, infection, nerve and/or blood vessel injury, persistent or recurrent pain, loosening or failure of the components, leg length inequality, dislocation, need for further surgery, blood clots, strokes, heart attacks or arrhythmias, pneumonia, etc.) and benefits of the surgical procedure were discussed. The patient understands that this rotator cuff tear may not be repairable. The patient states his understanding and agrees to proceed. A formal written consent will be obtained by the nursing staff.

## 2018-11-21 DIAGNOSIS — G8929 Other chronic pain: Secondary | ICD-10-CM | POA: Diagnosis not present

## 2018-11-21 DIAGNOSIS — I10 Essential (primary) hypertension: Secondary | ICD-10-CM | POA: Diagnosis not present

## 2018-11-21 DIAGNOSIS — R7303 Prediabetes: Secondary | ICD-10-CM | POA: Diagnosis not present

## 2018-11-21 DIAGNOSIS — M19179 Post-traumatic osteoarthritis, unspecified ankle and foot: Secondary | ICD-10-CM | POA: Diagnosis not present

## 2018-11-26 DIAGNOSIS — Z9889 Other specified postprocedural states: Secondary | ICD-10-CM | POA: Diagnosis not present

## 2018-12-03 DIAGNOSIS — M25611 Stiffness of right shoulder, not elsewhere classified: Secondary | ICD-10-CM | POA: Diagnosis not present

## 2018-12-03 DIAGNOSIS — M6281 Muscle weakness (generalized): Secondary | ICD-10-CM | POA: Diagnosis not present

## 2018-12-03 DIAGNOSIS — Z9889 Other specified postprocedural states: Secondary | ICD-10-CM | POA: Diagnosis not present

## 2018-12-03 DIAGNOSIS — M25511 Pain in right shoulder: Secondary | ICD-10-CM | POA: Diagnosis not present

## 2018-12-11 DIAGNOSIS — Z9889 Other specified postprocedural states: Secondary | ICD-10-CM | POA: Diagnosis not present

## 2019-01-21 DIAGNOSIS — Y9384 Activity, sleeping: Secondary | ICD-10-CM | POA: Diagnosis not present

## 2019-01-21 DIAGNOSIS — S46011A Strain of muscle(s) and tendon(s) of the rotator cuff of right shoulder, initial encounter: Secondary | ICD-10-CM | POA: Diagnosis not present

## 2019-01-21 DIAGNOSIS — S46911A Strain of unspecified muscle, fascia and tendon at shoulder and upper arm level, right arm, initial encounter: Secondary | ICD-10-CM | POA: Diagnosis not present

## 2019-01-21 DIAGNOSIS — X58XXXA Exposure to other specified factors, initial encounter: Secondary | ICD-10-CM | POA: Diagnosis not present

## 2019-01-24 DIAGNOSIS — Z79899 Other long term (current) drug therapy: Secondary | ICD-10-CM | POA: Diagnosis not present

## 2019-01-24 DIAGNOSIS — M10179 Lead-induced gout, unspecified ankle and foot: Secondary | ICD-10-CM | POA: Diagnosis not present

## 2019-01-24 DIAGNOSIS — I10 Essential (primary) hypertension: Secondary | ICD-10-CM | POA: Diagnosis not present

## 2019-01-24 DIAGNOSIS — G8929 Other chronic pain: Secondary | ICD-10-CM | POA: Diagnosis not present

## 2019-01-24 DIAGNOSIS — Z20822 Contact with and (suspected) exposure to covid-19: Secondary | ICD-10-CM | POA: Diagnosis not present

## 2019-01-29 DIAGNOSIS — S46011A Strain of muscle(s) and tendon(s) of the rotator cuff of right shoulder, initial encounter: Secondary | ICD-10-CM | POA: Diagnosis not present

## 2019-01-29 DIAGNOSIS — M75121 Complete rotator cuff tear or rupture of right shoulder, not specified as traumatic: Secondary | ICD-10-CM | POA: Diagnosis not present

## 2019-01-29 DIAGNOSIS — S46811A Strain of other muscles, fascia and tendons at shoulder and upper arm level, right arm, initial encounter: Secondary | ICD-10-CM | POA: Diagnosis not present

## 2019-02-04 DIAGNOSIS — S46011A Strain of muscle(s) and tendon(s) of the rotator cuff of right shoulder, initial encounter: Secondary | ICD-10-CM | POA: Diagnosis not present

## 2019-02-04 DIAGNOSIS — X500XXA Overexertion from strenuous movement or load, initial encounter: Secondary | ICD-10-CM | POA: Diagnosis not present

## 2019-02-28 DIAGNOSIS — M75121 Complete rotator cuff tear or rupture of right shoulder, not specified as traumatic: Secondary | ICD-10-CM | POA: Diagnosis not present

## 2019-03-12 DIAGNOSIS — Z20822 Contact with and (suspected) exposure to covid-19: Secondary | ICD-10-CM | POA: Diagnosis not present

## 2019-03-12 DIAGNOSIS — M75121 Complete rotator cuff tear or rupture of right shoulder, not specified as traumatic: Secondary | ICD-10-CM | POA: Diagnosis not present

## 2019-03-14 DIAGNOSIS — E669 Obesity, unspecified: Secondary | ICD-10-CM | POA: Diagnosis not present

## 2019-03-14 DIAGNOSIS — M19172 Post-traumatic osteoarthritis, left ankle and foot: Secondary | ICD-10-CM | POA: Diagnosis not present

## 2019-03-14 DIAGNOSIS — M75121 Complete rotator cuff tear or rupture of right shoulder, not specified as traumatic: Secondary | ICD-10-CM | POA: Diagnosis not present

## 2019-03-14 DIAGNOSIS — Z6831 Body mass index (BMI) 31.0-31.9, adult: Secondary | ICD-10-CM | POA: Diagnosis not present

## 2019-03-14 DIAGNOSIS — M24111 Other articular cartilage disorders, right shoulder: Secondary | ICD-10-CM | POA: Diagnosis not present

## 2019-03-14 DIAGNOSIS — Z79899 Other long term (current) drug therapy: Secondary | ICD-10-CM | POA: Diagnosis not present

## 2019-03-14 DIAGNOSIS — I10 Essential (primary) hypertension: Secondary | ICD-10-CM | POA: Diagnosis not present

## 2019-03-14 DIAGNOSIS — Z886 Allergy status to analgesic agent status: Secondary | ICD-10-CM | POA: Diagnosis not present

## 2019-03-20 DIAGNOSIS — Z9889 Other specified postprocedural states: Secondary | ICD-10-CM | POA: Diagnosis not present

## 2019-03-20 DIAGNOSIS — M25511 Pain in right shoulder: Secondary | ICD-10-CM | POA: Diagnosis not present

## 2019-03-20 DIAGNOSIS — M25611 Stiffness of right shoulder, not elsewhere classified: Secondary | ICD-10-CM | POA: Diagnosis not present

## 2019-03-20 DIAGNOSIS — M6281 Muscle weakness (generalized): Secondary | ICD-10-CM | POA: Diagnosis not present

## 2019-03-21 DIAGNOSIS — I10 Essential (primary) hypertension: Secondary | ICD-10-CM | POA: Diagnosis not present

## 2019-03-21 DIAGNOSIS — M19179 Post-traumatic osteoarthritis, unspecified ankle and foot: Secondary | ICD-10-CM | POA: Diagnosis not present

## 2019-03-21 DIAGNOSIS — G8929 Other chronic pain: Secondary | ICD-10-CM | POA: Diagnosis not present

## 2019-03-21 DIAGNOSIS — R7303 Prediabetes: Secondary | ICD-10-CM | POA: Diagnosis not present

## 2019-03-26 DIAGNOSIS — Z9889 Other specified postprocedural states: Secondary | ICD-10-CM | POA: Diagnosis not present

## 2019-04-04 DIAGNOSIS — Z9889 Other specified postprocedural states: Secondary | ICD-10-CM | POA: Diagnosis not present

## 2019-04-08 DIAGNOSIS — U071 COVID-19: Secondary | ICD-10-CM | POA: Diagnosis not present

## 2019-04-08 DIAGNOSIS — Z20822 Contact with and (suspected) exposure to covid-19: Secondary | ICD-10-CM | POA: Diagnosis not present

## 2019-04-23 DIAGNOSIS — Z9889 Other specified postprocedural states: Secondary | ICD-10-CM | POA: Diagnosis not present

## 2019-04-23 DIAGNOSIS — M6281 Muscle weakness (generalized): Secondary | ICD-10-CM | POA: Diagnosis not present

## 2019-04-23 DIAGNOSIS — M25611 Stiffness of right shoulder, not elsewhere classified: Secondary | ICD-10-CM | POA: Diagnosis not present

## 2019-04-23 DIAGNOSIS — M25511 Pain in right shoulder: Secondary | ICD-10-CM | POA: Diagnosis not present

## 2019-04-29 DIAGNOSIS — M25511 Pain in right shoulder: Secondary | ICD-10-CM | POA: Diagnosis not present

## 2019-04-29 DIAGNOSIS — M6281 Muscle weakness (generalized): Secondary | ICD-10-CM | POA: Diagnosis not present

## 2019-04-29 DIAGNOSIS — Z9889 Other specified postprocedural states: Secondary | ICD-10-CM | POA: Diagnosis not present

## 2019-04-29 DIAGNOSIS — M25611 Stiffness of right shoulder, not elsewhere classified: Secondary | ICD-10-CM | POA: Diagnosis not present

## 2019-05-02 DIAGNOSIS — M25611 Stiffness of right shoulder, not elsewhere classified: Secondary | ICD-10-CM | POA: Diagnosis not present

## 2019-05-02 DIAGNOSIS — M25511 Pain in right shoulder: Secondary | ICD-10-CM | POA: Diagnosis not present

## 2019-05-02 DIAGNOSIS — Z9889 Other specified postprocedural states: Secondary | ICD-10-CM | POA: Diagnosis not present

## 2019-05-02 DIAGNOSIS — M6281 Muscle weakness (generalized): Secondary | ICD-10-CM | POA: Diagnosis not present

## 2019-05-08 DIAGNOSIS — M25611 Stiffness of right shoulder, not elsewhere classified: Secondary | ICD-10-CM | POA: Diagnosis not present

## 2019-05-08 DIAGNOSIS — M25511 Pain in right shoulder: Secondary | ICD-10-CM | POA: Diagnosis not present

## 2019-05-08 DIAGNOSIS — Z9889 Other specified postprocedural states: Secondary | ICD-10-CM | POA: Diagnosis not present

## 2019-05-08 DIAGNOSIS — M6281 Muscle weakness (generalized): Secondary | ICD-10-CM | POA: Diagnosis not present

## 2019-05-10 DIAGNOSIS — M6281 Muscle weakness (generalized): Secondary | ICD-10-CM | POA: Diagnosis not present

## 2019-05-10 DIAGNOSIS — Z9889 Other specified postprocedural states: Secondary | ICD-10-CM | POA: Diagnosis not present

## 2019-05-10 DIAGNOSIS — M25511 Pain in right shoulder: Secondary | ICD-10-CM | POA: Diagnosis not present

## 2019-05-10 DIAGNOSIS — M25611 Stiffness of right shoulder, not elsewhere classified: Secondary | ICD-10-CM | POA: Diagnosis not present

## 2019-05-13 DIAGNOSIS — M25511 Pain in right shoulder: Secondary | ICD-10-CM | POA: Diagnosis not present

## 2019-05-13 DIAGNOSIS — M25611 Stiffness of right shoulder, not elsewhere classified: Secondary | ICD-10-CM | POA: Diagnosis not present

## 2019-05-13 DIAGNOSIS — M6281 Muscle weakness (generalized): Secondary | ICD-10-CM | POA: Diagnosis not present

## 2019-05-13 DIAGNOSIS — Z9889 Other specified postprocedural states: Secondary | ICD-10-CM | POA: Diagnosis not present

## 2019-05-15 DIAGNOSIS — Z9889 Other specified postprocedural states: Secondary | ICD-10-CM | POA: Diagnosis not present

## 2019-05-15 DIAGNOSIS — M25611 Stiffness of right shoulder, not elsewhere classified: Secondary | ICD-10-CM | POA: Diagnosis not present

## 2019-05-15 DIAGNOSIS — M25511 Pain in right shoulder: Secondary | ICD-10-CM | POA: Diagnosis not present

## 2019-05-15 DIAGNOSIS — M6281 Muscle weakness (generalized): Secondary | ICD-10-CM | POA: Diagnosis not present

## 2019-05-21 DIAGNOSIS — Z9889 Other specified postprocedural states: Secondary | ICD-10-CM | POA: Diagnosis not present

## 2019-05-21 DIAGNOSIS — M25611 Stiffness of right shoulder, not elsewhere classified: Secondary | ICD-10-CM | POA: Diagnosis not present

## 2019-05-21 DIAGNOSIS — M25511 Pain in right shoulder: Secondary | ICD-10-CM | POA: Diagnosis not present

## 2019-05-21 DIAGNOSIS — M6281 Muscle weakness (generalized): Secondary | ICD-10-CM | POA: Diagnosis not present

## 2019-05-27 DIAGNOSIS — M25511 Pain in right shoulder: Secondary | ICD-10-CM | POA: Diagnosis not present

## 2019-05-27 DIAGNOSIS — Z9889 Other specified postprocedural states: Secondary | ICD-10-CM | POA: Diagnosis not present

## 2019-05-27 DIAGNOSIS — M6281 Muscle weakness (generalized): Secondary | ICD-10-CM | POA: Diagnosis not present

## 2019-05-27 DIAGNOSIS — M25611 Stiffness of right shoulder, not elsewhere classified: Secondary | ICD-10-CM | POA: Diagnosis not present

## 2019-06-06 DIAGNOSIS — Z9889 Other specified postprocedural states: Secondary | ICD-10-CM | POA: Diagnosis not present

## 2019-06-06 DIAGNOSIS — M25511 Pain in right shoulder: Secondary | ICD-10-CM | POA: Diagnosis not present

## 2019-06-06 DIAGNOSIS — M25611 Stiffness of right shoulder, not elsewhere classified: Secondary | ICD-10-CM | POA: Diagnosis not present

## 2019-06-06 DIAGNOSIS — M6281 Muscle weakness (generalized): Secondary | ICD-10-CM | POA: Diagnosis not present

## 2019-06-11 DIAGNOSIS — G8929 Other chronic pain: Secondary | ICD-10-CM | POA: Diagnosis not present

## 2019-06-11 DIAGNOSIS — I10 Essential (primary) hypertension: Secondary | ICD-10-CM | POA: Diagnosis not present

## 2019-06-11 DIAGNOSIS — Z79899 Other long term (current) drug therapy: Secondary | ICD-10-CM | POA: Diagnosis not present

## 2019-06-11 DIAGNOSIS — M19179 Post-traumatic osteoarthritis, unspecified ankle and foot: Secondary | ICD-10-CM | POA: Diagnosis not present

## 2019-06-11 DIAGNOSIS — R7303 Prediabetes: Secondary | ICD-10-CM | POA: Diagnosis not present

## 2019-06-17 DIAGNOSIS — Z9889 Other specified postprocedural states: Secondary | ICD-10-CM | POA: Diagnosis not present

## 2019-08-14 DIAGNOSIS — M19179 Post-traumatic osteoarthritis, unspecified ankle and foot: Secondary | ICD-10-CM | POA: Diagnosis not present

## 2019-08-14 DIAGNOSIS — G8929 Other chronic pain: Secondary | ICD-10-CM | POA: Diagnosis not present

## 2019-08-14 DIAGNOSIS — I10 Essential (primary) hypertension: Secondary | ICD-10-CM | POA: Diagnosis not present

## 2019-08-14 DIAGNOSIS — R7303 Prediabetes: Secondary | ICD-10-CM | POA: Diagnosis not present

## 2019-09-09 DIAGNOSIS — N401 Enlarged prostate with lower urinary tract symptoms: Secondary | ICD-10-CM | POA: Diagnosis not present

## 2019-09-09 DIAGNOSIS — R3911 Hesitancy of micturition: Secondary | ICD-10-CM | POA: Diagnosis not present

## 2019-09-09 DIAGNOSIS — N529 Male erectile dysfunction, unspecified: Secondary | ICD-10-CM | POA: Diagnosis not present

## 2019-09-09 DIAGNOSIS — E291 Testicular hypofunction: Secondary | ICD-10-CM | POA: Diagnosis not present

## 2019-09-20 DIAGNOSIS — N529 Male erectile dysfunction, unspecified: Secondary | ICD-10-CM | POA: Diagnosis not present

## 2019-09-20 DIAGNOSIS — N400 Enlarged prostate without lower urinary tract symptoms: Secondary | ICD-10-CM | POA: Diagnosis not present

## 2019-09-20 DIAGNOSIS — E291 Testicular hypofunction: Secondary | ICD-10-CM | POA: Diagnosis not present

## 2019-10-07 DIAGNOSIS — Z9889 Other specified postprocedural states: Secondary | ICD-10-CM | POA: Diagnosis not present

## 2019-10-07 DIAGNOSIS — M5412 Radiculopathy, cervical region: Secondary | ICD-10-CM | POA: Diagnosis not present

## 2019-11-05 DIAGNOSIS — R7303 Prediabetes: Secondary | ICD-10-CM | POA: Diagnosis not present

## 2019-11-05 DIAGNOSIS — I10 Essential (primary) hypertension: Secondary | ICD-10-CM | POA: Diagnosis not present

## 2019-11-05 DIAGNOSIS — G8929 Other chronic pain: Secondary | ICD-10-CM | POA: Diagnosis not present

## 2019-11-05 DIAGNOSIS — M19179 Post-traumatic osteoarthritis, unspecified ankle and foot: Secondary | ICD-10-CM | POA: Diagnosis not present

## 2020-02-17 DIAGNOSIS — M5416 Radiculopathy, lumbar region: Secondary | ICD-10-CM | POA: Diagnosis not present

## 2020-02-17 DIAGNOSIS — G5602 Carpal tunnel syndrome, left upper limb: Secondary | ICD-10-CM | POA: Diagnosis not present

## 2020-02-17 DIAGNOSIS — R7303 Prediabetes: Secondary | ICD-10-CM | POA: Diagnosis not present

## 2020-02-17 DIAGNOSIS — G8929 Other chronic pain: Secondary | ICD-10-CM | POA: Diagnosis not present

## 2020-02-28 DIAGNOSIS — R7303 Prediabetes: Secondary | ICD-10-CM | POA: Diagnosis not present

## 2020-02-28 DIAGNOSIS — I10 Essential (primary) hypertension: Secondary | ICD-10-CM | POA: Diagnosis not present

## 2020-02-28 DIAGNOSIS — Z1331 Encounter for screening for depression: Secondary | ICD-10-CM | POA: Diagnosis not present

## 2020-02-28 DIAGNOSIS — M19179 Post-traumatic osteoarthritis, unspecified ankle and foot: Secondary | ICD-10-CM | POA: Diagnosis not present

## 2020-02-28 DIAGNOSIS — G8929 Other chronic pain: Secondary | ICD-10-CM | POA: Diagnosis not present

## 2020-05-20 DIAGNOSIS — I10 Essential (primary) hypertension: Secondary | ICD-10-CM | POA: Diagnosis not present

## 2020-05-20 DIAGNOSIS — R7303 Prediabetes: Secondary | ICD-10-CM | POA: Diagnosis not present

## 2020-05-20 DIAGNOSIS — Z79899 Other long term (current) drug therapy: Secondary | ICD-10-CM | POA: Diagnosis not present

## 2020-05-20 DIAGNOSIS — M19179 Post-traumatic osteoarthritis, unspecified ankle and foot: Secondary | ICD-10-CM | POA: Diagnosis not present

## 2020-05-20 DIAGNOSIS — G8929 Other chronic pain: Secondary | ICD-10-CM | POA: Diagnosis not present

## 2020-06-15 DIAGNOSIS — Z6830 Body mass index (BMI) 30.0-30.9, adult: Secondary | ICD-10-CM | POA: Diagnosis not present

## 2020-06-15 DIAGNOSIS — N400 Enlarged prostate without lower urinary tract symptoms: Secondary | ICD-10-CM | POA: Diagnosis not present

## 2020-06-15 DIAGNOSIS — N529 Male erectile dysfunction, unspecified: Secondary | ICD-10-CM | POA: Diagnosis not present

## 2020-06-15 DIAGNOSIS — E291 Testicular hypofunction: Secondary | ICD-10-CM | POA: Diagnosis not present

## 2020-07-22 DIAGNOSIS — G8929 Other chronic pain: Secondary | ICD-10-CM | POA: Diagnosis not present

## 2020-07-22 DIAGNOSIS — R7303 Prediabetes: Secondary | ICD-10-CM | POA: Diagnosis not present

## 2020-07-22 DIAGNOSIS — M19179 Post-traumatic osteoarthritis, unspecified ankle and foot: Secondary | ICD-10-CM | POA: Diagnosis not present

## 2020-07-22 DIAGNOSIS — I10 Essential (primary) hypertension: Secondary | ICD-10-CM | POA: Diagnosis not present

## 2020-08-17 DIAGNOSIS — G4733 Obstructive sleep apnea (adult) (pediatric): Secondary | ICD-10-CM | POA: Diagnosis not present

## 2020-08-17 DIAGNOSIS — R0602 Shortness of breath: Secondary | ICD-10-CM | POA: Diagnosis not present

## 2020-08-18 DIAGNOSIS — G4733 Obstructive sleep apnea (adult) (pediatric): Secondary | ICD-10-CM | POA: Diagnosis not present

## 2020-08-18 DIAGNOSIS — R0602 Shortness of breath: Secondary | ICD-10-CM | POA: Diagnosis not present

## 2020-10-02 DIAGNOSIS — I10 Essential (primary) hypertension: Secondary | ICD-10-CM | POA: Diagnosis not present

## 2020-10-02 DIAGNOSIS — G8929 Other chronic pain: Secondary | ICD-10-CM | POA: Diagnosis not present

## 2020-10-02 DIAGNOSIS — R7303 Prediabetes: Secondary | ICD-10-CM | POA: Diagnosis not present

## 2020-10-02 DIAGNOSIS — M19179 Post-traumatic osteoarthritis, unspecified ankle and foot: Secondary | ICD-10-CM | POA: Diagnosis not present

## 2020-10-06 NOTE — Progress Notes (Signed)
Patient: Darryl Roy  Service Category: E/M  Provider: Gaspar Cola, MD  DOB: 08/03/1970  DOS: 10/07/2020  Referring Provider: Fae Pippin  MRN: [id number]  Setting: Ambulatory outpatient  PCP: Cyndi Bender, PA-C  Type: New Patient  Specialty: Interventional Pain Management    Location: Office  Delivery: Face-to-face     Primary Reason(s) for Visit: Encounter for initial evaluation of one or more chronic problems (new to examiner) potentially causing chronic pain, and posing a threat to normal musculoskeletal function. (Level of risk: High) CC: Ankle Pain (left), Shoulder Pain (Right and left, right is worse), and Back Pain (low)  HPI  Mr. Darryl Roy is a 50 y.o. year old, male patient, who comes for the first time to our practice referred by Cyndi Bender, PA-C for our initial evaluation of his chronic pain. He has Benign localized prostatic hyperplasia with lower urinary tract symptoms (LUTS); Chronic prostatitis; ED (erectile dysfunction) of organic origin; Encounter for long-term (current) use of high-risk medication; Incomplete emptying of bladder; Increased frequency of urination; Nontraumatic complete tear of right rotator cuff; Post-traumatic arthritis of ankle, left; Rotator cuff tendinitis, right; Status post open reduction and internal fixaiton (ORIF) of syndesmosis; Tendinitis of upper biceps tendon of right shoulder; Testicular hypofunction; Chronic pain syndrome; Pharmacologic therapy; Disorder of skeletal system; Problems influencing health status; Chronic use of opiate for therapeutic purpose; 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 Today he comes in for evaluation of his Ankle Pain (left), Shoulder Pain (Right and left, right is worse), and Back Pain (low)  Pain  Assessment: Location: Left Ankle Radiating: denies Onset: More than a month ago Duration: Chronic pain Quality: Sharp, Aching Severity: 5 /10 (subjective, self-reported pain score)  Effect on ADL: limits activities Timing: Constant Modifying factors: elevate, ice BP: (!) 152/87  HR: 76  Onset and Duration: Date of onset: 15 years ago. Cause of pain: Work related accident or event Severity: NAS-11 at its worse: 9/10, NAS-11 at its best: 5/10, NAS-11 now: 5/10, and NAS-11 on the average: 6/10 Timing: Morning, Night, and After activity or exercise Aggravating Factors: Bending, Lifiting, Motion, Prolonged standing, Walking, and Working Alleviating Factors: Medications, Resting, and Using a brace Associated Problems: Numbness, Tingling, Pain that wakes patient up, and Pain that does not allow patient to sleep Quality of Pain: Aching, Deep, Exhausting, Sharp, and Shooting Previous Examinations or Tests: Bone scan and MRI scan Previous Treatments: Epidural steroid injections and Narcotic medications  According to the patient, I had actually seen him and treated him around 2002-4.  Unfortunately, this is prior to the current electronic medical record and I have no access to those notes at this time.  I will be requesting them for review.  Although the patient's referral indicates that he is being sent for the evaluation of Lumbar back pain with radiculopathy affecting the right lower extremity, today the patient indicates that he is primary pain is that of the ankle (Left).  According to the patient the pain is worse in the mornings.  He indicates having had 2 left ankle surgeries with the last 1 having been around 2016.  He describes that his pain started with a fracture that occurred around 2014.  The patient also indicates having had some injections (2-4) by Dr. Vickki Muff, which only lasted for a couple days.  Because  of the lack of effectiveness, those injections were stopped.  He describes his  last x-rays of the ankle as having been more than 4 years ago.  The patient has been seen at [facility] and he was recommended either an ankle fusion or a replacement however he was told that he was too young to have a replacement of the ankle.  He denies any recent physical therapy for the ankle pain.  The patient's secondary area of pain is that of the shoulders (Bilateral) (R>L).  In the case of the right shoulder he describes having had 2 prior shoulder surgeries with the last one having been done around 2021 at [facility].  His first surgery was done by Dr. Roland Rack but he describes that during the surgery he encountered a situation where he did not have the technical skills to correct and that why he was referred to [facility] where they seem to have corrected the problem.  He denies any shoulder replacements.  He indicates having had his last x-ray approximately a year ago.  He also describes having had some physical therapy stopped loose balls shoulder surgery.  He also describes having had an injection by his PCP without ultrasound or x-ray guidance.  He indicates that these seem to help for several months.  In the case of the left shoulder he denies any recent x-rays, surgery, or physical therapy.  He does admit to having had the steroid injections by his PCP, which also do seem to help.  The patient's third area pain is that of the lower back (Midline).  He denies any lower extremity pain, surgeries, recent x-rays, or physical therapy.  The pain is described to be in the midline and not radiating to either side.  Pharmacotherapy: The patient describes currently taking Percocet 10/325 1 tablet p.o. every 4 hours.  He describes that he has been taking this medication nonstop for the last 18 years.  He refers that he thinks he might have stopped the medication around 2002.  He describes being sent to [place] to take over the medication since his PCP practice is moving away from pain management.  In view of the information  provided I went ahead and explained to the patient that in summary it seemed like he has 2 primary problems.  One would be the chronic shoulder, ankle, low back pain and the second 1 would be his opioid analgesic management.  In terms of the first 1, I explained to him that there may be some therapies that I may be able to offer him regarding my specialty of interventional pain management, but with regards to the second one, I have explained to the patient that the problem is much more complicated.  To start with, we are short staffed and therefore we are not taking any patients for medication management only.  I also explained to him that he has a problem with opioid tolerance which would need to be addressed if he was to decide coming on to our practice.  I took the time to also explained to him that we do that using "Drug Holidays".  I took the opportunity to explain the concept of tolerance and how "Drug Holidays" help control it.  I detailed how to do a slow taper to avoid withdrawals.  This brought up some fears on the patient regarding stopping the medication and going through withdrawals.  I explained to him that that way that we do drug holidays is with a very slow taper of the narcotics  until we can stop them.  By doing it in this way, patient is rarely experiencing any type of physical withdrawals.  I also explained to him that in his particular case, because he takes approximately 60 mg/day of oxycodone, a rough estimate would indicate that it would take is approximately 14 weeks to complete the tapering and "Drug Holiday".  Today I took the time to provide the patient with information regarding my pain practice. The patient was informed that my practice is divided into two sections: an interventional pain management section, as well as a completely separate and distinct medication management section. I explained that I have procedure days for my interventional therapies, and evaluation days for  follow-ups and medication management. Because of the amount of documentation required during both, they are kept separated. This means that there is the possibility that he may be scheduled for a procedure on one day, and medication management the next. I have also informed him that because of staffing and facility limitations, I no longer take patients for medication management only. To illustrate the reasons for this, I gave the patient the example of surgeons, and how inappropriate it would be to refer a patient to his/her care, just to write for the post-surgical antibiotics on a surgery done by a different surgeon.   Because interventional pain management is my board-certified specialty, the patient was informed that joining my practice means that they are open to any and all interventional therapies. I made it clear that this does not mean that they will be forced to have any procedures done. What this means is that I believe interventional therapies to be essential part of the diagnosis and proper management of chronic pain conditions. Therefore, patients not interested in these interventional alternatives will be better served under the care of a different practitioner.  The patient was also made aware of my Comprehensive Pain Management Safety Guidelines where by joining my practice, they limit all of their nerve blocks and joint injections to those done by our practice, for as long as we are retained to manage their care.   Historic Controlled Substance Pharmacotherapy Review  PMP and historical list of controlled substances: Oxycodone/APAP 10/325 (#165/mo) Current opioid analgesics: .   Oxycodone/APAP 10/325, 1 tab p.o. every 4 hours (165/month) (82.5 MME/day) (10/02/2020) (prescribed by Heath Gold, PA) MME/day: 82.5 mg/day   Historical Monitoring: The patient  reports no history of drug use. List of all UDS Test(s): No results found for: MDMA, COCAINSCRNUR, [place], [place],  CANNABQUANT, THCU, [place] List of other Serum/Urine Drug Screening Test(s):  No results found for: AMPHSCRSER, BARBSCRSER, BENZOSCRSER, COCAINSCRSER, COCAINSCRNUR, PCPSCRSER, PCPQUANT, THCSCRSER, THCU, CANNABQUANT, OPIATESCRSER, OXYSCRSER, PROPOXSCRSER, ETH Historical Background Evaluation: [place] PMP: PDMP reviewed during this encounter. Online review of the past 90-monthperiod conducted.             PMP NARX Score Report:  Narcotic: 481 Sedative: 391 Stimulant: 000 [place] Department of public safety, offender search: (Editor, commissioningInformation) Non-contributory Risk Assessment Profile: Aberrant behavior: None observed or detected today Risk factors for fatal opioid overdose: None identified today PMP NARX Overdose Risk Score: 270 Fatal overdose hazard ratio (HR): Calculation deferred Non-fatal overdose hazard ratio (HR): Calculation deferred Risk of opioid abuse or dependence: 0.7-3.0% with doses ? 36 MME/day and 6.1-26% with doses ? 120 MME/day. Substance use disorder (SUD) risk level: See below Personal History of Substance Abuse (SUD-Substance use disorder):  Alcohol: Negative  Illegal Drugs: Negative  Rx Drugs: Negative  ORT Risk Level  calculation: Low Risk  Opioid Risk Tool - 10/07/20 0904       Family History of Substance Abuse   Alcohol Positive Male    Illegal Drugs Negative    Rx Drugs Negative      Personal History of Substance Abuse   Alcohol Negative    Illegal Drugs Negative    Rx Drugs Negative      Age   Age between 60-45 years  No      History of Preadolescent Sexual Abuse   History of Preadolescent Sexual Abuse Negative or Male      Psychological Disease   Psychological Disease Negative    Depression Negative      Total Score   Opioid Risk Tool Scoring 3    Opioid Risk Interpretation Low Risk            ORT Scoring interpretation table:  Score <3 = Low Risk for SUD  Score between 4-7 = Moderate Risk for SUD  Score >8 = High Risk for Opioid Abuse   PHQ-2  Depression Scale:  Total score: 0  PHQ-2 Scoring interpretation table: (Score and probability of major depressive disorder)  Score 0 = No depression  Score 1 = 15.4% Probability  Score 2 = 21.1% Probability  Score 3 = 38.4% Probability  Score 4 = 45.5% Probability  Score 5 = 56.4% Probability  Score 6 = 78.6% Probability   PHQ-9 Depression Scale:  Total score: 0  PHQ-9 Scoring interpretation table:  Score 0-4 = No depression  Score 5-9 = Mild depression  Score 10-14 = Moderate depression  Score 15-19 = Moderately severe depression  Score 20-27 = Severe depression (2.4 times higher risk of SUD and 2.89 times higher risk of overuse)   Pharmacologic Plan: As per protocol, I have not taken over any controlled substance management, pending the results of ordered tests and/or consults.            Initial impression: Pending review of available data and ordered tests.  Meds   Current Outpatient Medications:    busPIRone (BUSPAR) 10 MG tablet, Take 10 mg by mouth 3 (three) times daily., Disp: , Rfl:    olmesartan (BENICAR) 40 MG tablet, Take 40 mg by mouth daily., Disp: , Rfl:    oxyCODONE-acetaminophen (PERCOCET) 10-325 MG tablet, Take 1 tablet by mouth every 4 (four) hours as needed., Disp: , Rfl:    tadalafil (CIALIS) 5 MG tablet, Take 5 mg by mouth daily., Disp: , Rfl:    tamsulosin (FLOMAX) 0.4 MG CAPS capsule, Take 0.4 mg by mouth 2 (two) times daily., Disp: , Rfl:    testosterone cypionate (DEPOTESTOSTERONE CYPIONATE) 200 MG/ML injection, Inject 0.5 mLs into the muscle once a week., Disp: , Rfl:    zolpidem (AMBIEN) 10 MG tablet, Take 10 mg by mouth daily., Disp: , Rfl:   Imaging Review   Complexity Note: No results found under the [place] record.                        ROS  Cardiovascular: High blood pressure Pulmonary or Respiratory: Snoring  Neurological: Curved spine Psychological-Psychiatric: Anxiousness and Prone to  panicking Gastrointestinal: Alternating episodes iof diarrhea and constipation (IBS-Irritable bowe syndrome) Genitourinary: No reported renal or genitourinary signs or symptoms such as difficulty voiding or producing urine, peeing blood, non-functioning kidney, kidney stones, difficulty emptying the bladder, difficulty controlling the flow of urine, or chronic kidney disease Hematological: No reported hematological  signs or symptoms such as prolonged bleeding, low or poor functioning platelets, bruising or bleeding easily, hereditary bleeding problems, low energy levels due to low hemoglobin or being anemic Endocrine: No reported endocrine signs or symptoms such as high or low blood sugar, rapid heart rate due to high thyroid levels, obesity or weight gain due to slow thyroid or thyroid disease Rheumatologic: No reported rheumatological signs and symptoms such as fatigue, joint pain, tenderness, swelling, redness, heat, stiffness, decreased range of motion, with or without associated rash Musculoskeletal: Negative for myasthenia gravis, muscular dystrophy, multiple sclerosis or malignant hyperthermia Work History: Working full time  Allergies  Mr. Peretti is allergic to ibuprofen.  Laboratory Chemistry Profile   Renal Lab Results  Component Value Date   BUN 22 (H) 11/12/2018   CREATININE 0.99 11/12/2018   GFRAA >60 11/12/2018   GFRNONAA >60 11/12/2018     Electrolytes Lab Results  Component Value Date   NA 139 11/12/2018   K 3.9 11/12/2018   CL 103 11/12/2018   CALCIUM 9.3 11/12/2018     Hepatic No results found for: AST, ALT, ALBUMIN, ALKPHOS, AMYLASE, LIPASE, AMMONIA   ID Lab Results  Component Value Date   SARSCOV2NAA NEGATIVE 11/12/2018     Bone No results found for: [place], [id number], OY7741OI7, OM7672CN4, 25OHVITD1, 25OHVITD2, 25OHVITD3, TESTOFREE, TESTOSTERONE   Endocrine Lab Results  Component Value Date   GLUCOSE 117 (H) 11/12/2018     Neuropathy No results  found for: VITAMINB12, FOLATE, HGBA1C, HIV   CNS No results found for: COLORCSF, APPEARCSF, RBCCOUNTCSF, WBCCSF, POLYSCSF, LYMPHSCSF, EOSCSF, PROTEINCSF, GLUCCSF, JCVIRUS, CSFOLI, IGGCSF, LABACHR, ACETBL, LABACHR, ACETBL   Inflammation (CRP: Acute  ESR: Chronic) No results found for: CRP, ESRSEDRATE, LATICACIDVEN   Rheumatology No results found for: RF, ANA, LABURIC, URICUR, LYMEIGGIGMAB, LYMEABIGMQN, HLAB27   Coagulation Lab Results  Component Value Date   PLT 219 11/12/2018     Cardiovascular Lab Results  Component Value Date   HGB 15.4 11/12/2018   HCT 45.1 11/12/2018     Screening Lab Results  Component Value Date   SARSCOV2NAA NEGATIVE 11/12/2018     Cancer No results found for: CEA, CA125, LABCA2   Allergens No results found for: ALMOND, APPLE, ASPARAGUS, AVOCADO, BANANA, BARLEY, BASIL, B[place], GREENBEAN, LIM[place]EAN, WHITEBEAN, BEEFIGE, REDBEET, BLUEBERRY, BROCCOLI, CABBAGE, MELON, CARROT, CASEIN, CASHEWNUT, CAULIFLOWER, CELERY     Note: Lab results reviewed.  PFSH  Drug: Mr. Solly  reports no history of drug use. Alcohol:  reports no history of alcohol use. Tobacco:  reports that he has never smoked. He has never used smokeless tobacco. Medical:  has a past medical history of Anxiety, Hypertension, and IBS (irritable bowel syndrome). Family: family history includes COPD in his father and mother; Cancer in his mother.  Past Surgical History:  Procedure Laterality Date   ANKLE ARTHROSCOPY W/ OPEN REPAIR Left    SHOULDER ARTHROSCOPY WITH ROTATOR CUFF REPAIR AND SUBACROMIAL DECOMPRESSION Right 11/15/2018   Procedure: SHOULDER ARTHROSCOPY WITH DEBRIDEMENT, DECOMPRESSION, AND MASSIVE ROTATOR CUFF REPAIR.;  Surgeon: Corky Mull, MD;  Location: [facility];  Service: Orthopedics;  Laterality: Right;   VASECTOMY     VASECTOMY REVERSAL     Active Ambulatory Problems    Diagnosis Date Noted   Benign localized prostatic hyperplasia with lower urinary tract symptoms  (LUTS) 09/13/2011   Chronic prostatitis 09/13/2011   ED (erectile dysfunction) of organic origin 09/15/2011   Encounter for long-term (current) use of high-risk medication 04/23/2014   Incomplete emptying of bladder 01/01/2015  Increased frequency of urination 09/15/2011   Nontraumatic complete tear of right rotator cuff 09/07/2018   Post-traumatic arthritis of ankle, left 03/18/2015   Rotator cuff tendinitis, right 09/07/2018   Status post open reduction and internal fixaiton (ORIF) of syndesmosis 05/17/2013   Tendinitis of upper biceps tendon of right shoulder 09/07/2018   Testicular hypofunction 09/15/2011   Chronic pain syndrome 10/07/2020   Pharmacologic therapy 10/07/2020   Disorder of skeletal system 10/07/2020   Problems influencing health status 10/07/2020   Chronic use of opiate for therapeutic purpose 10/07/2020   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 In no apparent acute distress Vitals:   10/07/20 0851  BP: (!) 152/87  Pulse: 76  Resp: 16  Temp: (!) 97.2 F (36.2 C)  TempSrc: Temporal  SpO2: 100%  Weight: 220 lb (99.8 kg)  Height: 6' (1.829 m)   BMI Assessment: Estimated body mass index is 29.84 kg/m as calculated from the following:   Height as of this encounter: 6' (1.829 m).   Weight as of this encounter: 220 lb (99.8 kg).  BMI interpretation table: BMI level Category Range association with higher incidence of chronic pain  <18 kg/m2 Underweight   18.5-24.9  kg/m2 Ideal body weight   25-29.9 kg/m2 Overweight Increased incidence by 20%  30-34.9 kg/m2 Obese (Class I) Increased incidence by 68%  35-39.9 kg/m2 Severe obesity (Class II) Increased incidence by 136%  >40 kg/m2 Extreme obesity (Class III) Increased incidence by 254%   Patient's current BMI Ideal Body weight  Body mass index is 29.84 kg/m. Ideal body weight: 77.6 kg (171 lb 1.2 oz) Adjusted ideal body weight: 86.5 kg (190 lb 10.3 oz)   BMI Readings from Last 4 Encounters:  10/07/20 29.84 kg/m  11/15/18 29.84 kg/m  11/09/18 29.84 kg/m   Wt Readings from Last 4 Encounters:  10/07/20 220 lb (99.8 kg)  11/15/18 220 lb (99.8 kg)  11/09/18 220 lb (99.8 kg)    Psych/Mental status: Alert, oriented x 3 (person, place, & time)       Eyes: PERLA Respiratory: No evidence of acute respiratory distress  Assessment  Primary Diagnosis & Pertinent Problem List: The primary encounter diagnosis was Chronic shoulder pain (1ry area of Pain) (Bilateral) (R>L). 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  Visit Diagnosis (New problems to examiner): 1. Chronic shoulder pain (1ry area of Pain) (Bilateral) (R>L)   2. Chronic ankle pain (2ry area of Pain) (Left)   3. Chronic low back pain (3ry area of Pain) (Midline) w/o sciatica   4. Chronic pain syndrome   5. Disorder of skeletal system   6. Problems influencing health status   7. Pharmacologic therapy   8. Chronic use of opiate  for therapeutic purpose   9. 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 Cybulski was reminded that as per protocol, today's visit has been an evaluation only. We have not  taken over the patient's controlled substance management.  Problem-specific plan: No problem-specific Assessment & Plan notes found for this encounter. Lab Orders         Compliance Drug Analysis, Ur         Comp. Metabolic Panel (12)         Magnesium         Vitamin B12         Sedimentation rate         25-Hydroxy vitamin D Lcms D2+D3         C-reactive protein     Imaging Orders         CT SHOULDER RIGHT WO CONTRAST         CT SHOULDER LEFT WO CONTRAST         DG Lumbar Spine Complete W/Bend         DG Ankle Complete Left         DG Foot Complete Left     Referral Orders  No referral(s) requested today   Procedure Orders    No procedure(s) ordered today   Pharmacotherapy (current): Medications ordered:  No orders of the defined types were placed in this encounter.  Medications administered during this visit: Avry A. Sayed had no medications administered during this visit.   Pharmacological management options:  Opioid Analgesics: The patient was informed that there is no guarantee that he would be a candidate for opioid analgesics. The decision will be made following CDC guidelines. This decision will be based on the results of diagnostic studies, as well as Mr. Kauk risk profile.   Membrane stabilizer: To be determined at a later time  Muscle relaxant: To be determined at a later time  NSAID: To be determined at a later time  Other analgesic(s): To be determined at a later time   Interventional management options: Mr. Moorman was informed that there is no guarantee that he would be a candidate for interventional therapies. The decision will be based on the results of diagnostic studies, as well as Mr. Taillon risk profile.  Procedure(s) under consideration:  Pending results of ordered studies      Interventional Therapies  Risk  Complexity Considerations:   Estimated body mass index is 29.84 kg/m as calculated from the following:   Height as of this  encounter: 6' (1.829 m).   Weight as of this encounter: 220 lb (99.8 kg). WNL   Planned  Pending:   Pending further evaluation   Under consideration:   12-week opioid analgesic tapering, follow by 2-week "Drug Holiday". 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 Diagnostic left common peroneal vs superficial peroneal NB with possible RFA follow-up.    Completed:   None at this time   Therapeutic  Palliative (PRN) options:   None established    Provider-requested follow-up: Return for (54mn), Eval-day (M,W), (F2F), 2nd Visit, for review of ordered tests.  Future Appointments  Date Time Provider DRienzi 11/25/2020  8:00 AM NMilinda Pointer MD [facility] None     Note by: FGaspar Cola MD Date: 10/07/2020; Time: 11:31 AM

## 2020-10-07 ENCOUNTER — Encounter: Payer: Self-pay | Admitting: Pain Medicine

## 2020-10-07 ENCOUNTER — Other Ambulatory Visit: Payer: Self-pay

## 2020-10-07 ENCOUNTER — Ambulatory Visit: Payer: BC Managed Care – PPO | Attending: Pain Medicine | Admitting: Pain Medicine

## 2020-10-07 VITALS — BP 152/87 | HR 76 | Temp 97.2°F | Resp 16 | Ht 72.0 in | Wt 220.0 lb

## 2020-10-07 DIAGNOSIS — G894 Chronic pain syndrome: Secondary | ICD-10-CM | POA: Insufficient documentation

## 2020-10-07 DIAGNOSIS — G8929 Other chronic pain: Secondary | ICD-10-CM | POA: Insufficient documentation

## 2020-10-07 DIAGNOSIS — F1190 Opioid use, unspecified, uncomplicated: Secondary | ICD-10-CM | POA: Diagnosis not present

## 2020-10-07 DIAGNOSIS — M25511 Pain in right shoulder: Secondary | ICD-10-CM | POA: Diagnosis not present

## 2020-10-07 DIAGNOSIS — M545 Low back pain, unspecified: Secondary | ICD-10-CM | POA: Diagnosis not present

## 2020-10-07 DIAGNOSIS — M25572 Pain in left ankle and joints of left foot: Secondary | ICD-10-CM | POA: Insufficient documentation

## 2020-10-07 DIAGNOSIS — M899 Disorder of bone, unspecified: Secondary | ICD-10-CM | POA: Diagnosis not present

## 2020-10-07 DIAGNOSIS — Z789 Other specified health status: Secondary | ICD-10-CM | POA: Diagnosis not present

## 2020-10-07 DIAGNOSIS — Z79899 Other long term (current) drug therapy: Secondary | ICD-10-CM | POA: Diagnosis not present

## 2020-10-07 DIAGNOSIS — M25512 Pain in left shoulder: Secondary | ICD-10-CM | POA: Diagnosis not present

## 2020-10-07 DIAGNOSIS — Z79891 Long term (current) use of opiate analgesic: Secondary | ICD-10-CM | POA: Insufficient documentation

## 2020-10-07 NOTE — Patient Instructions (Signed)
____________________________________________________________________________________________  Drug Holidays (Slow)  What is a "Drug Holiday"? Drug Holiday: is the name given to the period of time during which a patient stops taking a medication(s) for the purpose of eliminating tolerance to the drug.  Benefits . Improved effectiveness of opioids. . Decreased opioid dose needed to achieve benefits. . Improved pain with lesser dose.  What is tolerance? Tolerance: is the progressive decreased in effectiveness of a drug due to its repetitive use. With repetitive use, the body gets use to the medication and as a consequence, it loses its effectiveness. This is a common problem seen with opioid pain medications. As a result, a larger dose of the drug is needed to achieve the same effect that used to be obtained with a smaller dose.  How long should a "Drug Holiday" last? You should stay off of the pain medicine for at least 14 consecutive days. (2 weeks)  Should I stop the medicine "cold turkey"? No. You should always coordinate with your Pain Specialist so that he/she can provide you with the correct medication dose to make the transition as smoothly as possible.  How do I stop the medicine? Slowly. You will be instructed to decrease the daily amount of pills that you take by one (1) pill every seven (7) days. This is called a "slow downward taper" of your dose. For example: if you normally take four (4) pills per day, you will be asked to drop this dose to three (3) pills per day for seven (7) days, then to two (2) pills per day for seven (7) days, then to one (1) per day for seven (7) days, and at the end of those last seven (7) days, this is when the "Drug Holiday" would start.   Will I have withdrawals? By doing a "slow downward taper" like this one, it is unlikely that you will experience any significant withdrawal symptoms. Typically, what triggers withdrawals is the sudden stop of a high  dose opioid therapy. Withdrawals can usually be avoided by slowly decreasing the dose over a prolonged period of time. If you do not follow these instructions and decide to stop your medication abruptly, withdrawals may be possible.  What are withdrawals? Withdrawals: refers to the wide range of symptoms that occur after stopping or dramatically reducing opiate drugs after heavy and prolonged use. Withdrawal symptoms do not occur to patients that use low dose opioids, or those who take the medication sporadically. Contrary to benzodiazepine (example: Valium, Xanax, etc.) or alcohol withdrawals ("Delirium Tremens"), opioid withdrawals are not lethal. Withdrawals are the physical manifestation of the body getting rid of the excess receptors.  Expected Symptoms Early symptoms of withdrawal may include: . Agitation . Anxiety . Muscle aches . Increased tearing . Insomnia . Runny nose . Sweating . Yawning  Late symptoms of withdrawal may include: . Abdominal cramping . Diarrhea . Dilated pupils . Goose bumps . Nausea . Vomiting  Will I experience withdrawals? Due to the slow nature of the taper, it is very unlikely that you will experience any.  What is a slow taper? Taper: refers to the gradual decrease in dose.  (Last update: 07/31/2019) ____________________________________________________________________________________________     

## 2020-10-07 NOTE — Progress Notes (Signed)
Nursing Pain Medication Assessment:  Safety precautions to be maintained throughout the outpatient stay will include: orient to surroundings, keep bed in low position, maintain call bell within reach at all times, provide assistance with transfer out of bed and ambulation.  Medication Inspection Compliance: Pill count conducted under aseptic conditions, in front of the patient. Neither the pills nor the bottle was removed from the patient's sight at any time. Once count was completed pills were immediately returned to the patient in their original bottle.  Medication: Oxycodone/APAP Pill/Patch Count:  137 of 165 pills remain Pill/Patch Appearance: Markings consistent with prescribed medication Bottle Appearance: Standard pharmacy container. Clearly labeled. Filled Date:  0 9 / 23 / 2022 Last Medication intake:  Today

## 2020-10-13 LAB — COMPLIANCE DRUG ANALYSIS, UR

## 2020-10-29 ENCOUNTER — Ambulatory Visit: Payer: BC Managed Care – PPO

## 2020-10-29 ENCOUNTER — Ambulatory Visit: Admission: RE | Admit: 2020-10-29 | Payer: BC Managed Care – PPO | Source: Ambulatory Visit

## 2020-11-24 NOTE — Progress Notes (Deleted)
No show

## 2020-11-25 ENCOUNTER — Ambulatory Visit: Payer: BC Managed Care – PPO | Admitting: Pain Medicine

## 2020-12-10 DIAGNOSIS — Z79891 Long term (current) use of opiate analgesic: Secondary | ICD-10-CM | POA: Diagnosis not present

## 2020-12-10 DIAGNOSIS — M25571 Pain in right ankle and joints of right foot: Secondary | ICD-10-CM | POA: Diagnosis not present

## 2020-12-10 DIAGNOSIS — G894 Chronic pain syndrome: Secondary | ICD-10-CM | POA: Diagnosis not present

## 2020-12-21 DIAGNOSIS — M25571 Pain in right ankle and joints of right foot: Secondary | ICD-10-CM | POA: Diagnosis not present

## 2020-12-21 DIAGNOSIS — Z79899 Other long term (current) drug therapy: Secondary | ICD-10-CM | POA: Diagnosis not present

## 2020-12-21 DIAGNOSIS — G894 Chronic pain syndrome: Secondary | ICD-10-CM | POA: Diagnosis not present

## 2020-12-21 DIAGNOSIS — Z1389 Encounter for screening for other disorder: Secondary | ICD-10-CM | POA: Diagnosis not present

## 2020-12-22 DIAGNOSIS — M25571 Pain in right ankle and joints of right foot: Secondary | ICD-10-CM | POA: Diagnosis not present

## 2020-12-25 DIAGNOSIS — M25571 Pain in right ankle and joints of right foot: Secondary | ICD-10-CM | POA: Diagnosis not present

## 2021-01-01 DIAGNOSIS — Z79899 Other long term (current) drug therapy: Secondary | ICD-10-CM | POA: Diagnosis not present

## 2021-01-01 DIAGNOSIS — M25571 Pain in right ankle and joints of right foot: Secondary | ICD-10-CM | POA: Diagnosis not present

## 2021-01-01 DIAGNOSIS — G894 Chronic pain syndrome: Secondary | ICD-10-CM | POA: Diagnosis not present

## 2021-01-15 DIAGNOSIS — I10 Essential (primary) hypertension: Secondary | ICD-10-CM | POA: Diagnosis not present

## 2021-01-15 DIAGNOSIS — G4700 Insomnia, unspecified: Secondary | ICD-10-CM | POA: Diagnosis not present

## 2021-01-15 DIAGNOSIS — F410 Panic disorder [episodic paroxysmal anxiety] without agoraphobia: Secondary | ICD-10-CM | POA: Diagnosis not present

## 2021-01-15 DIAGNOSIS — M25571 Pain in right ankle and joints of right foot: Secondary | ICD-10-CM | POA: Diagnosis not present

## 2021-01-15 DIAGNOSIS — Z79891 Long term (current) use of opiate analgesic: Secondary | ICD-10-CM | POA: Diagnosis not present

## 2021-01-15 DIAGNOSIS — G894 Chronic pain syndrome: Secondary | ICD-10-CM | POA: Diagnosis not present

## 2021-01-15 DIAGNOSIS — R7303 Prediabetes: Secondary | ICD-10-CM | POA: Diagnosis not present

## 2021-01-29 DIAGNOSIS — G894 Chronic pain syndrome: Secondary | ICD-10-CM | POA: Diagnosis not present

## 2021-01-29 DIAGNOSIS — Z79899 Other long term (current) drug therapy: Secondary | ICD-10-CM | POA: Diagnosis not present

## 2021-01-29 DIAGNOSIS — I10 Essential (primary) hypertension: Secondary | ICD-10-CM | POA: Diagnosis not present

## 2021-01-29 DIAGNOSIS — R7303 Prediabetes: Secondary | ICD-10-CM | POA: Diagnosis not present

## 2021-01-29 DIAGNOSIS — M25571 Pain in right ankle and joints of right foot: Secondary | ICD-10-CM | POA: Diagnosis not present

## 2021-02-19 DIAGNOSIS — Z79891 Long term (current) use of opiate analgesic: Secondary | ICD-10-CM | POA: Diagnosis not present

## 2021-02-19 DIAGNOSIS — G894 Chronic pain syndrome: Secondary | ICD-10-CM | POA: Diagnosis not present

## 2021-02-19 DIAGNOSIS — M25571 Pain in right ankle and joints of right foot: Secondary | ICD-10-CM | POA: Diagnosis not present

## 2021-03-12 DIAGNOSIS — Z79891 Long term (current) use of opiate analgesic: Secondary | ICD-10-CM | POA: Diagnosis not present

## 2021-03-12 DIAGNOSIS — M25571 Pain in right ankle and joints of right foot: Secondary | ICD-10-CM | POA: Diagnosis not present

## 2021-03-12 DIAGNOSIS — G894 Chronic pain syndrome: Secondary | ICD-10-CM | POA: Diagnosis not present

## 2021-04-02 DIAGNOSIS — M25571 Pain in right ankle and joints of right foot: Secondary | ICD-10-CM | POA: Diagnosis not present

## 2021-04-02 DIAGNOSIS — G894 Chronic pain syndrome: Secondary | ICD-10-CM | POA: Diagnosis not present

## 2021-04-02 DIAGNOSIS — Z79891 Long term (current) use of opiate analgesic: Secondary | ICD-10-CM | POA: Diagnosis not present

## 2021-04-30 DIAGNOSIS — G894 Chronic pain syndrome: Secondary | ICD-10-CM | POA: Diagnosis not present

## 2021-04-30 DIAGNOSIS — Z79899 Other long term (current) drug therapy: Secondary | ICD-10-CM | POA: Diagnosis not present

## 2021-04-30 DIAGNOSIS — M25571 Pain in right ankle and joints of right foot: Secondary | ICD-10-CM | POA: Diagnosis not present

## 2021-05-17 DIAGNOSIS — Z681 Body mass index (BMI) 19 or less, adult: Secondary | ICD-10-CM | POA: Diagnosis not present

## 2021-05-17 DIAGNOSIS — R399 Unspecified symptoms and signs involving the genitourinary system: Secondary | ICD-10-CM | POA: Diagnosis not present

## 2021-05-17 DIAGNOSIS — N529 Male erectile dysfunction, unspecified: Secondary | ICD-10-CM | POA: Diagnosis not present

## 2021-05-17 DIAGNOSIS — E291 Testicular hypofunction: Secondary | ICD-10-CM | POA: Diagnosis not present

## 2021-05-28 DIAGNOSIS — G894 Chronic pain syndrome: Secondary | ICD-10-CM | POA: Diagnosis not present

## 2021-05-28 DIAGNOSIS — Z79899 Other long term (current) drug therapy: Secondary | ICD-10-CM | POA: Diagnosis not present

## 2021-05-28 DIAGNOSIS — M25571 Pain in right ankle and joints of right foot: Secondary | ICD-10-CM | POA: Diagnosis not present

## 2021-06-04 DIAGNOSIS — S39012A Strain of muscle, fascia and tendon of lower back, initial encounter: Secondary | ICD-10-CM | POA: Diagnosis not present

## 2021-06-22 DIAGNOSIS — M25571 Pain in right ankle and joints of right foot: Secondary | ICD-10-CM | POA: Diagnosis not present

## 2021-06-22 DIAGNOSIS — Z79891 Long term (current) use of opiate analgesic: Secondary | ICD-10-CM | POA: Diagnosis not present

## 2021-06-22 DIAGNOSIS — G894 Chronic pain syndrome: Secondary | ICD-10-CM | POA: Diagnosis not present

## 2021-07-21 DIAGNOSIS — G4700 Insomnia, unspecified: Secondary | ICD-10-CM | POA: Diagnosis not present

## 2021-07-21 DIAGNOSIS — R7303 Prediabetes: Secondary | ICD-10-CM | POA: Diagnosis not present

## 2021-07-21 DIAGNOSIS — I10 Essential (primary) hypertension: Secondary | ICD-10-CM | POA: Diagnosis not present

## 2021-07-21 DIAGNOSIS — F410 Panic disorder [episodic paroxysmal anxiety] without agoraphobia: Secondary | ICD-10-CM | POA: Diagnosis not present

## 2021-07-23 DIAGNOSIS — G894 Chronic pain syndrome: Secondary | ICD-10-CM | POA: Diagnosis not present

## 2021-07-23 DIAGNOSIS — Z79891 Long term (current) use of opiate analgesic: Secondary | ICD-10-CM | POA: Diagnosis not present

## 2021-07-23 DIAGNOSIS — Z1389 Encounter for screening for other disorder: Secondary | ICD-10-CM | POA: Diagnosis not present

## 2021-07-23 DIAGNOSIS — M25571 Pain in right ankle and joints of right foot: Secondary | ICD-10-CM | POA: Diagnosis not present

## 2021-08-20 DIAGNOSIS — G894 Chronic pain syndrome: Secondary | ICD-10-CM | POA: Diagnosis not present

## 2021-08-20 DIAGNOSIS — M25571 Pain in right ankle and joints of right foot: Secondary | ICD-10-CM | POA: Diagnosis not present

## 2021-08-20 DIAGNOSIS — Z79899 Other long term (current) drug therapy: Secondary | ICD-10-CM | POA: Diagnosis not present

## 2021-08-23 DIAGNOSIS — F410 Panic disorder [episodic paroxysmal anxiety] without agoraphobia: Secondary | ICD-10-CM | POA: Diagnosis not present

## 2021-08-24 DIAGNOSIS — S39012A Strain of muscle, fascia and tendon of lower back, initial encounter: Secondary | ICD-10-CM | POA: Diagnosis not present

## 2021-09-17 DIAGNOSIS — G894 Chronic pain syndrome: Secondary | ICD-10-CM | POA: Diagnosis not present

## 2021-09-17 DIAGNOSIS — M25571 Pain in right ankle and joints of right foot: Secondary | ICD-10-CM | POA: Diagnosis not present

## 2021-09-17 DIAGNOSIS — Z79899 Other long term (current) drug therapy: Secondary | ICD-10-CM | POA: Diagnosis not present

## 2021-10-06 ENCOUNTER — Ambulatory Visit: Payer: BC Managed Care – PPO | Admitting: Dermatology

## 2021-10-06 DIAGNOSIS — D225 Melanocytic nevi of trunk: Secondary | ICD-10-CM

## 2021-10-06 DIAGNOSIS — D1801 Hemangioma of skin and subcutaneous tissue: Secondary | ICD-10-CM | POA: Diagnosis not present

## 2021-10-06 DIAGNOSIS — L719 Rosacea, unspecified: Secondary | ICD-10-CM

## 2021-10-06 DIAGNOSIS — L814 Other melanin hyperpigmentation: Secondary | ICD-10-CM

## 2021-10-06 DIAGNOSIS — D485 Neoplasm of uncertain behavior of skin: Secondary | ICD-10-CM

## 2021-10-06 DIAGNOSIS — L219 Seborrheic dermatitis, unspecified: Secondary | ICD-10-CM | POA: Diagnosis not present

## 2021-10-06 DIAGNOSIS — L905 Scar conditions and fibrosis of skin: Secondary | ICD-10-CM

## 2021-10-06 DIAGNOSIS — Z1283 Encounter for screening for malignant neoplasm of skin: Secondary | ICD-10-CM

## 2021-10-06 DIAGNOSIS — D229 Melanocytic nevi, unspecified: Secondary | ICD-10-CM

## 2021-10-06 DIAGNOSIS — L578 Other skin changes due to chronic exposure to nonionizing radiation: Secondary | ICD-10-CM

## 2021-10-06 DIAGNOSIS — L821 Other seborrheic keratosis: Secondary | ICD-10-CM

## 2021-10-06 DIAGNOSIS — D239 Other benign neoplasm of skin, unspecified: Secondary | ICD-10-CM

## 2021-10-06 HISTORY — DX: Other benign neoplasm of skin, unspecified: D23.9

## 2021-10-06 MED ORDER — AZELAIC ACID 15 % EX GEL: CUTANEOUS | 3 refills | Status: AC

## 2021-10-06 NOTE — Patient Instructions (Addendum)
Wound Care Instructions  Cleanse wound gently with soap and water once a day then pat dry with clean gauze. Apply a thin coat of Petrolatum (petroleum jelly, "Vaseline") over the wound (unless you have an allergy to this). We recommend that you use a new, sterile tube of Vaseline. Do not pick or remove scabs. Do not remove the yellow or white "healing tissue" from the base of the wound.  Cover the wound with fresh, clean, nonstick gauze and secure with paper tape. You may use Band-Aids in place of gauze and tape if the wound is small enough, but would recommend trimming much of the tape off as there is often too much. Sometimes Band-Aids can irritate the skin.  You should call the office for your biopsy report after 1 week if you have not already been contacted.  If you experience any problems, such as abnormal amounts of bleeding, swelling, significant bruising, significant pain, or evidence of infection, please call the office immediately.  FOR ADULT SURGERY PATIENTS: If you need something for pain relief you may take 1 extra strength Tylenol (acetaminophen) AND 2 Ibuprofen (200mg each) together every 4 hours as needed for pain. (do not take these if you are allergic to them or if you have a reason you should not take them.) Typically, you may only need pain medication for 1 to 3 days.      Instructions for [facility] Medications  One or more of your medications was sent to the [facility] mail order compounding pharmacy. You will receive an email from them and can purchase the medicine through that link. It will then be mailed to your home at the address you confirmed. If for any reason you do not receive an email from them, please check your spam folder. If you still do not find the email, please let us know. [facility] phone number is [phone].      Due to recent changes in healthcare laws, you may see results of your pathology and/or laboratory studies on MyChart before  the doctors have had a chance to review them. We understand that in some cases there may be results that are confusing or concerning to you. Please understand that not all results are received at the same time and often the doctors may need to interpret multiple results in order to provide you with the best plan of care or course of treatment. Therefore, we ask that you please give us 2 business days to thoroughly review all your results before contacting the office for clarification. Should we see a critical lab result, you will be contacted sooner.   If You Need Anything After Your Visit  If you have any questions or concerns for your doctor, please call our main line at [phone] and press option 4 to reach your doctor's medical assistant. If no one answers, please leave a voicemail as directed and we will return your call as soon as possible. Messages left after 4 pm will be answered the following business day.   You may also send us a message via MyChart. We typically respond to MyChart messages within 1-2 business days.  For prescription refills, please ask your pharmacy to contact our office. Our fax number is [phone].  If you have an urgent issue when the clinic is closed that cannot wait until the next business day, you can page your doctor at the number below.    Please note that while we do our best to be available for urgent   issues outside of office hours, we are not available 24/7.   If you have an urgent issue and are unable to reach us, you may choose to seek medical care at your doctor's office, retail clinic, urgent care center, or emergency room.  If you have a medical emergency, please immediately call 911 or go to the emergency department.  Pager Numbers  - Dr. Kowalski: [phone]  - Dr. Moye: [phone]  - Dr. Stewart: [phone]  In the event of inclement weather, please call our main line at [phone] for an update on the status of any delays or  closures.  Dermatology Medication Tips: Please keep the boxes that topical medications come in in order to help keep track of the instructions about where and how to use these. Pharmacies typically print the medication instructions only on the boxes and not directly on the medication tubes.   If your medication is too expensive, please contact our office at [phone] option 4 or send us a message through MyChart.   We are unable to tell what your co-pay for medications will be in advance as this is different depending on your insurance coverage. However, we may be able to find a substitute medication at lower cost or fill out paperwork to get insurance to cover a needed medication.   If a prior authorization is required to get your medication covered by your insurance company, please allow us 1-2 business days to complete this process.  Drug prices often vary depending on where the prescription is filled and some pharmacies may offer cheaper prices.  The website www.goodrx.com contains coupons for medications through different pharmacies. The prices here do not account for what the cost may be with help from insurance (it may be cheaper with your insurance), but the website can give you the price if you did not use any insurance.  - You can print the associated coupon and take it with your prescription to the pharmacy.  - You may also stop by our office during regular business hours and pick up a GoodRx coupon card.  - If you need your prescription sent electronically to a different pharmacy, notify our office through [facility] MyChart or by phone at [phone] option 4.     Si Usted Necesita Algo Despus de Su Visita  Tambin puede enviarnos un mensaje a travs de MyChart. Por lo general respondemos a los mensajes de MyChart en el transcurso de 1 a 2 das hbiles.  Para renovar recetas, por favor pida a su farmacia que se ponga en contacto con nuestra oficina. Nuestro nmero de fax  es el [phone].  Si tiene un asunto urgente cuando la clnica est cerrada y que no puede esperar hasta el siguiente da hbil, puede llamar/localizar a su doctor(a) al nmero que aparece a continuacin.   Por favor, tenga en cuenta que aunque hacemos todo lo posible para estar disponibles para asuntos urgentes fuera del horario de oficina, no estamos disponibles las 24 horas del da, los 7 das de la semana.   Si tiene un problema urgente y no puede comunicarse con nosotros, puede optar por buscar atencin mdica  en el consultorio de su doctor(a), en una clnica privada, en un centro de atencin urgente o en una sala de emergencias.  Si tiene una emergencia mdica, por favor llame inmediatamente al 911 o vaya a la sala de emergencias.  Nmeros de bper  - Dr. Kowalski: [phone]  - Dra. Moye: [phone]  - Dra. Stewart: [phone]  En   caso de inclemencias del tiempo, por favor llame a nuestra lnea principal al [phone] para una actualizacin sobre el estado de cualquier retraso o cierre.  Consejos para la medicacin en dermatologa: Por favor, guarde las cajas en las que vienen los medicamentos de uso tpico para ayudarle a seguir las instrucciones sobre dnde y cmo usarlos. Las farmacias generalmente imprimen las instrucciones del medicamento slo en las cajas y no directamente en los tubos del medicamento.   Si su medicamento es muy caro, por favor, pngase en contacto con nuestra oficina llamando al [phone] y presione la opcin 4 o envenos un mensaje a travs de MyChart.   No podemos decirle cul ser su copago por los medicamentos por adelantado ya que esto es diferente dependiendo de la cobertura de su seguro. Sin embargo, es posible que podamos encontrar un medicamento sustituto a menor costo o llenar un formulario para que el seguro cubra el medicamento que se considera necesario.   Si se requiere una autorizacin previa para que su compaa de seguros cubra  su medicamento, por favor permtanos de 1 a 2 das hbiles para completar este proceso.  Los precios de los medicamentos varan con frecuencia dependiendo del lugar de dnde se surte la receta y alguna farmacias pueden ofrecer precios ms baratos.  El sitio web www.goodrx.com tiene cupones para medicamentos de diferentes farmacias. Los precios aqu no tienen en cuenta lo que podra costar con la ayuda del seguro (puede ser ms barato con su seguro), pero el sitio web puede darle el precio si no utiliz ningn seguro.  - Puede imprimir el cupn correspondiente y llevarlo con su receta a la farmacia.  - Tambin puede pasar por nuestra oficina durante el horario de atencin regular y recoger una tarjeta de cupones de GoodRx.  - Si necesita que su receta se enve electrnicamente a una farmacia diferente, informe a nuestra oficina a travs de MyChart de [place] o por telfono llamando al [phone] y presione la opcin 4.  

## 2021-10-06 NOTE — Progress Notes (Unsigned)
New Patient Visit  Subjective  Darryl Roy is a 51 y.o. male who presents for the following: Annual Exam (Itchy, irritated nevus on the back that patient would like checked today. Hx irregular nevi which were excised about 10 years ago at [facility]). The patient presents for Total-Body Skin Exam (TBSE) for skin cancer screening and mole check.  The patient has spots, moles and lesions to be evaluated, some may be new or changing and the patient has concerns that these could be cancer.  The following portions of the chart were reviewed this encounter and updated as appropriate:   Tobacco  Allergies  Meds  Problems  Med Hx  Surg Hx  Fam Hx     Review of Systems:  No other skin or systemic complaints except as noted in HPI or Assessment and Plan.  Objective  Well appearing patient in no apparent distress; mood and affect are within normal limits.  A full examination was performed including scalp, head, eyes, ears, nose, lips, neck, chest, axillae, abdomen, back, buttocks, bilateral upper extremities, bilateral lower extremities, hands, feet, fingers, toes, fingernails, and toenails. All findings within normal limits unless otherwise noted below. Patient defers examining genital area today.  Scalp Pink patches with greasy scale.   L mid back 7.0 cm lat to spine 1.1 cm irregular brown macule.      midline upper abdomen 1.6 cm irregular brown macule.     R sup buttocks 0.5 cm slightly irregular brown macule.      Assessment & Plan  Seborrheic dermatitis Scalp Seborrheic Dermatitis  -  is a chronic persistent rash characterized by pinkness and scaling most commonly of the mid face but also can occur on the scalp (dandruff), ears; mid chest, mid back and groin.  It tends to be exacerbated by stress and cooler weather.  People who have neurologic disease may experience new onset or exacerbation of existing seborrheic dermatitis.  The condition is not curable but  treatable and can be controlled.  Continue OTC medicated shampoo daily.  May add ketoconazole 2% shampoo if needed in the future.  Scar conditions and fibrosis of skin Acne scarring from younger days. Back, face Due to acne - Benign-appearing.  Observation.  Call clinic for new or changing lesions.  Recommend daily use of broad spectrum spf 30+ sunscreen to sun-exposed areas.    Rosacea Face Rosacea is a chronic progressive skin condition usually affecting the face of adults, causing redness and/or acne bumps. It is treatable but not curable. It sometimes affects the eyes (ocular rosacea) as well. It may respond to topical and/or systemic medication and can flare with stress, sun exposure, alcohol, exercise, topical steroids (including hydrocortisone/cortisone 10) and some foods.  Daily application of broad spectrum spf 30+ sunscreen to face is recommended to reduce flares.  Start [facility] mix QHS.  Azelaic Acid 15 % gel - Face Apply to face QHS.  Neoplasm of uncertain behavior of skin (2) L mid back 7.0 cm lat to spine Epidermal / dermal shaving  Lesion diameter (cm):  1.1 Informed consent: discussed and consent obtained   Timeout: patient name, date of birth, surgical site, and procedure verified   Procedure prep:  Patient was prepped and draped in usual sterile fashion Prep type:  Isopropyl alcohol Anesthesia: the lesion was anesthetized in a standard fashion   Anesthetic:  1% lidocaine w/ epinephrine 1-100,000 buffered w/ 8.4% NaHCO3 Instrument used: flexible razor blade   Hemostasis achieved with: pressure, aluminum chloride  and electrodesiccation   Outcome: patient tolerated procedure well   Post-procedure details: sterile dressing applied and wound care instructions given   Dressing type: bandage and petrolatum    Specimen 1 - Surgical pathology Differential Diagnosis: D48.5 r/o dysplastic nevus Check Margins: No  midline upper abdomen Epidermal / dermal  shaving  Lesion diameter (cm):  1.6 Informed consent: discussed and consent obtained   Timeout: patient name, date of birth, surgical site, and procedure verified   Procedure prep:  Patient was prepped and draped in usual sterile fashion Prep type:  Isopropyl alcohol Anesthesia: the lesion was anesthetized in a standard fashion   Anesthetic:  1% lidocaine w/ epinephrine 1-100,000 buffered w/ 8.4% NaHCO3 Instrument used: flexible razor blade   Hemostasis achieved with: pressure, aluminum chloride and electrodesiccation   Outcome: patient tolerated procedure well   Post-procedure details: sterile dressing applied and wound care instructions given   Dressing type: bandage and petrolatum    Specimen 2 - Surgical pathology Differential Diagnosis: D48.5 r/o dysplastic nevus Check Margins: No  Nevus R sup buttocks Slightly irregular appearing. If today's neo's positive for dysplasia recommend biopsying nevus.  Lentigines - Scattered tan macules - Due to sun exposure - Benign-appearing, observe - Recommend daily broad spectrum sunscreen SPF 30+ to sun-exposed areas, reapply every 2 hours as needed. - Call for any changes  Seborrheic Keratoses - Stuck-on, waxy, tan-brown papules and/or plaques  - Benign-appearing - Discussed benign etiology and prognosis. - Observe - Call for any changes  Melanocytic Nevi - Tan-brown and/or pink-flesh-colored symmetric macules and papules - Benign appearing on exam today - Observation - Call clinic for new or changing moles - Recommend daily use of broad spectrum spf 30+ sunscreen to sun-exposed areas.   Hemangiomas - Red papules - Discussed benign nature - Observe - Call for any changes  Actinic Damage - Chronic condition, secondary to cumulative UV/sun exposure - diffuse scaly erythematous macules with underlying dyspigmentation - Recommend daily broad spectrum sunscreen SPF 30+ to sun-exposed areas, reapply every 2 hours as needed.  -  Staying in the shade or wearing long sleeves, sun glasses (UVA+UVB protection) and wide brim hats (4-inch brim around the entire circumference of the hat) are also recommended for sun protection.  - Call for new or changing lesions.  Skin cancer screening performed today.  Return for 1 year tbse .  Luther Redo, CMA, am acting as scribe for Sarina Ser, MD . Documentation: I have reviewed the above documentation for accuracy and completeness, and I agree with the above.  Sarina Ser, MD

## 2021-10-07 ENCOUNTER — Encounter: Payer: Self-pay | Admitting: Dermatology

## 2021-10-11 ENCOUNTER — Telehealth: Payer: Self-pay

## 2021-10-11 NOTE — Telephone Encounter (Signed)
-----   Message from Ralene Bathe, MD sent at 10/08/2021  6:25 PM EDT ----- Diagnosis 1. 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 Skin , midline upper abdomen DYSPLASTIC COMPOUND NEVUS WITH MILD ATYPIA, PERIPHERAL AND DEEP MARGINS INVOLVED  1&2 - both Mild Dysplastic Recheck next visit

## 2021-10-11 NOTE — Telephone Encounter (Signed)
Advised pt of bx results/sh ?

## 2021-10-13 DIAGNOSIS — M25571 Pain in right ankle and joints of right foot: Secondary | ICD-10-CM | POA: Diagnosis not present

## 2021-10-13 DIAGNOSIS — Z79899 Other long term (current) drug therapy: Secondary | ICD-10-CM | POA: Diagnosis not present

## 2021-10-13 DIAGNOSIS — G894 Chronic pain syndrome: Secondary | ICD-10-CM | POA: Diagnosis not present

## 2021-11-11 DIAGNOSIS — M19012 Primary osteoarthritis, left shoulder: Secondary | ICD-10-CM | POA: Diagnosis not present

## 2021-11-11 DIAGNOSIS — H6121 Impacted cerumen, right ear: Secondary | ICD-10-CM | POA: Diagnosis not present

## 2021-11-11 DIAGNOSIS — H6691 Otitis media, unspecified, right ear: Secondary | ICD-10-CM | POA: Diagnosis not present

## 2021-11-11 DIAGNOSIS — M25512 Pain in left shoulder: Secondary | ICD-10-CM | POA: Diagnosis not present

## 2021-11-12 DIAGNOSIS — G894 Chronic pain syndrome: Secondary | ICD-10-CM | POA: Diagnosis not present

## 2021-11-12 DIAGNOSIS — M25571 Pain in right ankle and joints of right foot: Secondary | ICD-10-CM | POA: Diagnosis not present

## 2021-11-12 DIAGNOSIS — Z79899 Other long term (current) drug therapy: Secondary | ICD-10-CM | POA: Diagnosis not present

## 2021-11-27 DIAGNOSIS — H60501 Unspecified acute noninfective otitis externa, right ear: Secondary | ICD-10-CM | POA: Diagnosis not present

## 2021-11-27 DIAGNOSIS — H6121 Impacted cerumen, right ear: Secondary | ICD-10-CM | POA: Diagnosis not present

## 2021-12-10 DIAGNOSIS — M19019 Primary osteoarthritis, unspecified shoulder: Secondary | ICD-10-CM | POA: Diagnosis not present

## 2021-12-10 DIAGNOSIS — Z79899 Other long term (current) drug therapy: Secondary | ICD-10-CM | POA: Diagnosis not present

## 2021-12-10 DIAGNOSIS — G894 Chronic pain syndrome: Secondary | ICD-10-CM | POA: Diagnosis not present

## 2021-12-10 DIAGNOSIS — M25512 Pain in left shoulder: Secondary | ICD-10-CM | POA: Diagnosis not present

## 2021-12-10 DIAGNOSIS — M25571 Pain in right ankle and joints of right foot: Secondary | ICD-10-CM | POA: Diagnosis not present

## 2021-12-31 DIAGNOSIS — F1990 Other psychoactive substance use, unspecified, uncomplicated: Secondary | ICD-10-CM | POA: Diagnosis not present

## 2021-12-31 DIAGNOSIS — G4700 Insomnia, unspecified: Secondary | ICD-10-CM | POA: Diagnosis not present

## 2021-12-31 DIAGNOSIS — G8929 Other chronic pain: Secondary | ICD-10-CM | POA: Diagnosis not present

## 2021-12-31 DIAGNOSIS — K922 Gastrointestinal hemorrhage, unspecified: Secondary | ICD-10-CM | POA: Diagnosis not present

## 2022-01-13 DIAGNOSIS — M19019 Primary osteoarthritis, unspecified shoulder: Secondary | ICD-10-CM | POA: Diagnosis not present

## 2022-01-13 DIAGNOSIS — M25512 Pain in left shoulder: Secondary | ICD-10-CM | POA: Diagnosis not present

## 2022-01-13 DIAGNOSIS — M25571 Pain in right ankle and joints of right foot: Secondary | ICD-10-CM | POA: Diagnosis not present

## 2022-01-26 DIAGNOSIS — K649 Unspecified hemorrhoids: Secondary | ICD-10-CM | POA: Diagnosis not present

## 2022-01-28 DIAGNOSIS — N529 Male erectile dysfunction, unspecified: Secondary | ICD-10-CM | POA: Diagnosis not present

## 2022-01-28 DIAGNOSIS — E291 Testicular hypofunction: Secondary | ICD-10-CM | POA: Diagnosis not present

## 2022-01-28 DIAGNOSIS — N400 Enlarged prostate without lower urinary tract symptoms: Secondary | ICD-10-CM | POA: Diagnosis not present

## 2022-02-01 DIAGNOSIS — M19012 Primary osteoarthritis, left shoulder: Secondary | ICD-10-CM | POA: Diagnosis not present

## 2022-02-02 DIAGNOSIS — M19179 Post-traumatic osteoarthritis, unspecified ankle and foot: Secondary | ICD-10-CM | POA: Diagnosis not present

## 2022-02-15 DIAGNOSIS — M25571 Pain in right ankle and joints of right foot: Secondary | ICD-10-CM | POA: Diagnosis not present

## 2022-02-15 DIAGNOSIS — M19019 Primary osteoarthritis, unspecified shoulder: Secondary | ICD-10-CM | POA: Diagnosis not present

## 2022-03-01 DIAGNOSIS — R7303 Prediabetes: Secondary | ICD-10-CM | POA: Diagnosis not present

## 2022-03-01 DIAGNOSIS — F410 Panic disorder [episodic paroxysmal anxiety] without agoraphobia: Secondary | ICD-10-CM | POA: Diagnosis not present

## 2022-03-01 DIAGNOSIS — Z1331 Encounter for screening for depression: Secondary | ICD-10-CM | POA: Diagnosis not present

## 2022-03-01 DIAGNOSIS — E7800 Pure hypercholesterolemia, unspecified: Secondary | ICD-10-CM | POA: Diagnosis not present

## 2022-03-01 DIAGNOSIS — M19072 Primary osteoarthritis, left ankle and foot: Secondary | ICD-10-CM | POA: Diagnosis not present

## 2022-03-01 DIAGNOSIS — I10 Essential (primary) hypertension: Secondary | ICD-10-CM | POA: Diagnosis not present

## 2022-03-07 DIAGNOSIS — M25512 Pain in left shoulder: Secondary | ICD-10-CM | POA: Diagnosis not present

## 2022-03-07 DIAGNOSIS — M25571 Pain in right ankle and joints of right foot: Secondary | ICD-10-CM | POA: Diagnosis not present

## 2022-03-07 DIAGNOSIS — M19019 Primary osteoarthritis, unspecified shoulder: Secondary | ICD-10-CM | POA: Diagnosis not present

## 2022-04-04 DIAGNOSIS — Z79899 Other long term (current) drug therapy: Secondary | ICD-10-CM | POA: Diagnosis not present

## 2022-04-04 DIAGNOSIS — M25571 Pain in right ankle and joints of right foot: Secondary | ICD-10-CM | POA: Diagnosis not present

## 2022-04-04 DIAGNOSIS — G894 Chronic pain syndrome: Secondary | ICD-10-CM | POA: Diagnosis not present

## 2022-05-05 DIAGNOSIS — M25571 Pain in right ankle and joints of right foot: Secondary | ICD-10-CM | POA: Diagnosis not present

## 2022-05-26 DIAGNOSIS — M5432 Sciatica, left side: Secondary | ICD-10-CM | POA: Diagnosis not present

## 2022-06-07 DIAGNOSIS — M25571 Pain in right ankle and joints of right foot: Secondary | ICD-10-CM | POA: Diagnosis not present

## 2022-06-07 DIAGNOSIS — M25512 Pain in left shoulder: Secondary | ICD-10-CM | POA: Diagnosis not present

## 2022-06-07 DIAGNOSIS — M19019 Primary osteoarthritis, unspecified shoulder: Secondary | ICD-10-CM | POA: Diagnosis not present

## 2022-07-06 DIAGNOSIS — G894 Chronic pain syndrome: Secondary | ICD-10-CM | POA: Diagnosis not present

## 2022-07-06 DIAGNOSIS — M25512 Pain in left shoulder: Secondary | ICD-10-CM | POA: Diagnosis not present

## 2022-07-06 DIAGNOSIS — M19019 Primary osteoarthritis, unspecified shoulder: Secondary | ICD-10-CM | POA: Diagnosis not present

## 2022-07-06 DIAGNOSIS — M25571 Pain in right ankle and joints of right foot: Secondary | ICD-10-CM | POA: Diagnosis not present

## 2022-08-09 DIAGNOSIS — Z1389 Encounter for screening for other disorder: Secondary | ICD-10-CM | POA: Diagnosis not present

## 2022-08-09 DIAGNOSIS — G894 Chronic pain syndrome: Secondary | ICD-10-CM | POA: Diagnosis not present

## 2022-08-09 DIAGNOSIS — M25512 Pain in left shoulder: Secondary | ICD-10-CM | POA: Diagnosis not present

## 2022-08-25 DIAGNOSIS — M79605 Pain in left leg: Secondary | ICD-10-CM | POA: Diagnosis not present

## 2022-08-25 DIAGNOSIS — M545 Low back pain, unspecified: Secondary | ICD-10-CM | POA: Diagnosis not present

## 2022-09-02 DIAGNOSIS — R7303 Prediabetes: Secondary | ICD-10-CM | POA: Diagnosis not present

## 2022-09-02 DIAGNOSIS — E7800 Pure hypercholesterolemia, unspecified: Secondary | ICD-10-CM | POA: Diagnosis not present

## 2022-09-02 DIAGNOSIS — I10 Essential (primary) hypertension: Secondary | ICD-10-CM | POA: Diagnosis not present

## 2022-09-02 DIAGNOSIS — F410 Panic disorder [episodic paroxysmal anxiety] without agoraphobia: Secondary | ICD-10-CM | POA: Diagnosis not present

## 2022-09-06 DIAGNOSIS — Z79891 Long term (current) use of opiate analgesic: Secondary | ICD-10-CM | POA: Diagnosis not present

## 2022-09-06 DIAGNOSIS — M25512 Pain in left shoulder: Secondary | ICD-10-CM | POA: Diagnosis not present

## 2022-09-06 DIAGNOSIS — G894 Chronic pain syndrome: Secondary | ICD-10-CM | POA: Diagnosis not present

## 2022-09-06 DIAGNOSIS — M25571 Pain in right ankle and joints of right foot: Secondary | ICD-10-CM | POA: Diagnosis not present

## 2022-09-06 DIAGNOSIS — M19019 Primary osteoarthritis, unspecified shoulder: Secondary | ICD-10-CM | POA: Diagnosis not present

## 2022-09-12 DIAGNOSIS — H1033 Unspecified acute conjunctivitis, bilateral: Secondary | ICD-10-CM | POA: Diagnosis not present

## 2022-10-05 DIAGNOSIS — M25571 Pain in right ankle and joints of right foot: Secondary | ICD-10-CM | POA: Diagnosis not present

## 2022-10-05 DIAGNOSIS — Z79899 Other long term (current) drug therapy: Secondary | ICD-10-CM | POA: Diagnosis not present

## 2022-10-05 DIAGNOSIS — G894 Chronic pain syndrome: Secondary | ICD-10-CM | POA: Diagnosis not present

## 2022-10-12 ENCOUNTER — Ambulatory Visit: Payer: BC Managed Care – PPO | Admitting: Dermatology

## 2022-10-19 DIAGNOSIS — H579 Unspecified disorder of eye and adnexa: Secondary | ICD-10-CM | POA: Diagnosis not present

## 2022-10-27 DIAGNOSIS — L2389 Allergic contact dermatitis due to other agents: Secondary | ICD-10-CM | POA: Diagnosis not present

## 2022-11-04 DIAGNOSIS — H579 Unspecified disorder of eye and adnexa: Secondary | ICD-10-CM | POA: Diagnosis not present

## 2022-11-10 DIAGNOSIS — Z79899 Other long term (current) drug therapy: Secondary | ICD-10-CM | POA: Diagnosis not present

## 2022-11-10 DIAGNOSIS — M25512 Pain in left shoulder: Secondary | ICD-10-CM | POA: Diagnosis not present

## 2022-11-10 DIAGNOSIS — G894 Chronic pain syndrome: Secondary | ICD-10-CM | POA: Diagnosis not present

## 2022-11-10 DIAGNOSIS — M19019 Primary osteoarthritis, unspecified shoulder: Secondary | ICD-10-CM | POA: Diagnosis not present

## 2022-12-22 DIAGNOSIS — M19019 Primary osteoarthritis, unspecified shoulder: Secondary | ICD-10-CM | POA: Diagnosis not present

## 2022-12-22 DIAGNOSIS — M25512 Pain in left shoulder: Secondary | ICD-10-CM | POA: Diagnosis not present

## 2022-12-22 DIAGNOSIS — M25571 Pain in right ankle and joints of right foot: Secondary | ICD-10-CM | POA: Diagnosis not present

## 2023-01-11 DIAGNOSIS — M25512 Pain in left shoulder: Secondary | ICD-10-CM | POA: Diagnosis not present

## 2023-01-23 DIAGNOSIS — M12812 Other specific arthropathies, not elsewhere classified, left shoulder: Secondary | ICD-10-CM | POA: Diagnosis not present

## 2023-01-23 DIAGNOSIS — M25512 Pain in left shoulder: Secondary | ICD-10-CM | POA: Diagnosis not present

## 2023-01-23 DIAGNOSIS — G8929 Other chronic pain: Secondary | ICD-10-CM | POA: Diagnosis not present

## 2023-01-23 DIAGNOSIS — M19012 Primary osteoarthritis, left shoulder: Secondary | ICD-10-CM | POA: Diagnosis not present

## 2023-01-24 DIAGNOSIS — M25571 Pain in right ankle and joints of right foot: Secondary | ICD-10-CM | POA: Diagnosis not present

## 2023-01-24 DIAGNOSIS — M19019 Primary osteoarthritis, unspecified shoulder: Secondary | ICD-10-CM | POA: Diagnosis not present

## 2023-01-24 DIAGNOSIS — G894 Chronic pain syndrome: Secondary | ICD-10-CM | POA: Diagnosis not present

## 2023-01-24 DIAGNOSIS — M25512 Pain in left shoulder: Secondary | ICD-10-CM | POA: Diagnosis not present

## 2023-01-24 DIAGNOSIS — Z1389 Encounter for screening for other disorder: Secondary | ICD-10-CM | POA: Diagnosis not present

## 2023-02-03 DIAGNOSIS — G8929 Other chronic pain: Secondary | ICD-10-CM | POA: Diagnosis not present

## 2023-02-03 DIAGNOSIS — M12812 Other specific arthropathies, not elsewhere classified, left shoulder: Secondary | ICD-10-CM | POA: Diagnosis not present

## 2023-02-03 DIAGNOSIS — M75122 Complete rotator cuff tear or rupture of left shoulder, not specified as traumatic: Secondary | ICD-10-CM | POA: Diagnosis not present

## 2023-02-03 DIAGNOSIS — S46212A Strain of muscle, fascia and tendon of other parts of biceps, left arm, initial encounter: Secondary | ICD-10-CM | POA: Diagnosis not present

## 2023-02-03 DIAGNOSIS — M6788 Other specified disorders of synovium and tendon, other site: Secondary | ICD-10-CM | POA: Diagnosis not present

## 2023-02-03 DIAGNOSIS — M19012 Primary osteoarthritis, left shoulder: Secondary | ICD-10-CM | POA: Diagnosis not present

## 2023-02-03 DIAGNOSIS — M25512 Pain in left shoulder: Secondary | ICD-10-CM | POA: Diagnosis not present

## 2023-02-22 DIAGNOSIS — M12812 Other specific arthropathies, not elsewhere classified, left shoulder: Secondary | ICD-10-CM | POA: Diagnosis not present

## 2023-02-23 DIAGNOSIS — M19019 Primary osteoarthritis, unspecified shoulder: Secondary | ICD-10-CM | POA: Diagnosis not present

## 2023-02-23 DIAGNOSIS — M25571 Pain in right ankle and joints of right foot: Secondary | ICD-10-CM | POA: Diagnosis not present

## 2023-02-23 DIAGNOSIS — G894 Chronic pain syndrome: Secondary | ICD-10-CM | POA: Diagnosis not present

## 2023-02-23 DIAGNOSIS — M25512 Pain in left shoulder: Secondary | ICD-10-CM | POA: Diagnosis not present

## 2023-03-10 DIAGNOSIS — Z1331 Encounter for screening for depression: Secondary | ICD-10-CM | POA: Diagnosis not present

## 2023-03-10 DIAGNOSIS — Z125 Encounter for screening for malignant neoplasm of prostate: Secondary | ICD-10-CM | POA: Diagnosis not present

## 2023-03-10 DIAGNOSIS — I10 Essential (primary) hypertension: Secondary | ICD-10-CM | POA: Diagnosis not present

## 2023-03-10 DIAGNOSIS — E7800 Pure hypercholesterolemia, unspecified: Secondary | ICD-10-CM | POA: Diagnosis not present

## 2023-03-10 DIAGNOSIS — R7303 Prediabetes: Secondary | ICD-10-CM | POA: Diagnosis not present

## 2023-03-10 DIAGNOSIS — F410 Panic disorder [episodic paroxysmal anxiety] without agoraphobia: Secondary | ICD-10-CM | POA: Diagnosis not present

## 2023-03-13 DIAGNOSIS — I10 Essential (primary) hypertension: Secondary | ICD-10-CM | POA: Diagnosis not present

## 2023-03-13 DIAGNOSIS — D649 Anemia, unspecified: Secondary | ICD-10-CM | POA: Diagnosis not present

## 2023-03-13 DIAGNOSIS — N401 Enlarged prostate with lower urinary tract symptoms: Secondary | ICD-10-CM | POA: Diagnosis not present

## 2023-03-13 DIAGNOSIS — M19012 Primary osteoarthritis, left shoulder: Secondary | ICD-10-CM | POA: Diagnosis not present

## 2023-03-13 DIAGNOSIS — Z79899 Other long term (current) drug therapy: Secondary | ICD-10-CM | POA: Diagnosis not present

## 2023-03-13 DIAGNOSIS — Z01818 Encounter for other preprocedural examination: Secondary | ICD-10-CM | POA: Diagnosis not present

## 2023-03-13 DIAGNOSIS — G894 Chronic pain syndrome: Secondary | ICD-10-CM | POA: Diagnosis not present

## 2023-03-23 DIAGNOSIS — M25512 Pain in left shoulder: Secondary | ICD-10-CM | POA: Diagnosis not present

## 2023-03-23 DIAGNOSIS — G894 Chronic pain syndrome: Secondary | ICD-10-CM | POA: Diagnosis not present

## 2023-03-23 DIAGNOSIS — M19019 Primary osteoarthritis, unspecified shoulder: Secondary | ICD-10-CM | POA: Diagnosis not present

## 2023-03-23 DIAGNOSIS — M25571 Pain in right ankle and joints of right foot: Secondary | ICD-10-CM | POA: Diagnosis not present

## 2023-03-30 DIAGNOSIS — M19012 Primary osteoarthritis, left shoulder: Secondary | ICD-10-CM | POA: Diagnosis not present

## 2023-03-30 DIAGNOSIS — Z79624 Long term (current) use of inhibitors of nucleotide synthesis: Secondary | ICD-10-CM | POA: Diagnosis not present

## 2023-03-30 DIAGNOSIS — Z79890 Hormone replacement therapy (postmenopausal): Secondary | ICD-10-CM | POA: Diagnosis not present

## 2023-03-30 DIAGNOSIS — M75122 Complete rotator cuff tear or rupture of left shoulder, not specified as traumatic: Secondary | ICD-10-CM | POA: Diagnosis not present

## 2023-03-30 DIAGNOSIS — N401 Enlarged prostate with lower urinary tract symptoms: Secondary | ICD-10-CM | POA: Diagnosis not present

## 2023-03-30 DIAGNOSIS — Z96612 Presence of left artificial shoulder joint: Secondary | ICD-10-CM | POA: Diagnosis not present

## 2023-03-30 DIAGNOSIS — G8918 Other acute postprocedural pain: Secondary | ICD-10-CM | POA: Diagnosis not present

## 2023-03-30 DIAGNOSIS — Z886 Allergy status to analgesic agent status: Secondary | ICD-10-CM | POA: Diagnosis not present

## 2023-03-30 DIAGNOSIS — M7502 Adhesive capsulitis of left shoulder: Secondary | ICD-10-CM | POA: Diagnosis not present

## 2023-03-30 DIAGNOSIS — M25712 Osteophyte, left shoulder: Secondary | ICD-10-CM | POA: Diagnosis not present

## 2023-03-30 DIAGNOSIS — I10 Essential (primary) hypertension: Secondary | ICD-10-CM | POA: Diagnosis not present

## 2023-03-30 DIAGNOSIS — Z471 Aftercare following joint replacement surgery: Secondary | ICD-10-CM | POA: Diagnosis not present

## 2023-03-30 DIAGNOSIS — G894 Chronic pain syndrome: Secondary | ICD-10-CM | POA: Diagnosis not present

## 2023-03-30 DIAGNOSIS — Z79891 Long term (current) use of opiate analgesic: Secondary | ICD-10-CM | POA: Diagnosis not present

## 2023-03-30 DIAGNOSIS — M12812 Other specific arthropathies, not elsewhere classified, left shoulder: Secondary | ICD-10-CM | POA: Diagnosis not present

## 2023-04-07 DIAGNOSIS — E291 Testicular hypofunction: Secondary | ICD-10-CM | POA: Diagnosis not present

## 2023-04-07 DIAGNOSIS — N529 Male erectile dysfunction, unspecified: Secondary | ICD-10-CM | POA: Diagnosis not present

## 2023-04-07 DIAGNOSIS — N400 Enlarged prostate without lower urinary tract symptoms: Secondary | ICD-10-CM | POA: Diagnosis not present

## 2023-04-10 DIAGNOSIS — M6281 Muscle weakness (generalized): Secondary | ICD-10-CM | POA: Diagnosis not present

## 2023-04-10 DIAGNOSIS — M25512 Pain in left shoulder: Secondary | ICD-10-CM | POA: Diagnosis not present

## 2023-04-10 DIAGNOSIS — Z96612 Presence of left artificial shoulder joint: Secondary | ICD-10-CM | POA: Diagnosis not present

## 2023-04-10 DIAGNOSIS — M25612 Stiffness of left shoulder, not elsewhere classified: Secondary | ICD-10-CM | POA: Diagnosis not present

## 2023-04-12 DIAGNOSIS — M12812 Other specific arthropathies, not elsewhere classified, left shoulder: Secondary | ICD-10-CM | POA: Diagnosis not present

## 2023-04-12 DIAGNOSIS — E291 Testicular hypofunction: Secondary | ICD-10-CM | POA: Diagnosis not present

## 2023-04-12 DIAGNOSIS — N400 Enlarged prostate without lower urinary tract symptoms: Secondary | ICD-10-CM | POA: Diagnosis not present

## 2023-04-13 DIAGNOSIS — M25612 Stiffness of left shoulder, not elsewhere classified: Secondary | ICD-10-CM | POA: Diagnosis not present

## 2023-04-13 DIAGNOSIS — M25512 Pain in left shoulder: Secondary | ICD-10-CM | POA: Diagnosis not present

## 2023-04-13 DIAGNOSIS — Z96612 Presence of left artificial shoulder joint: Secondary | ICD-10-CM | POA: Diagnosis not present

## 2023-04-13 DIAGNOSIS — M6281 Muscle weakness (generalized): Secondary | ICD-10-CM | POA: Diagnosis not present

## 2023-04-18 DIAGNOSIS — M25612 Stiffness of left shoulder, not elsewhere classified: Secondary | ICD-10-CM | POA: Diagnosis not present

## 2023-04-18 DIAGNOSIS — Z96612 Presence of left artificial shoulder joint: Secondary | ICD-10-CM | POA: Diagnosis not present

## 2023-04-18 DIAGNOSIS — M25512 Pain in left shoulder: Secondary | ICD-10-CM | POA: Diagnosis not present

## 2023-04-18 DIAGNOSIS — M6281 Muscle weakness (generalized): Secondary | ICD-10-CM | POA: Diagnosis not present

## 2023-04-19 DIAGNOSIS — M25512 Pain in left shoulder: Secondary | ICD-10-CM | POA: Diagnosis not present

## 2023-04-19 DIAGNOSIS — G894 Chronic pain syndrome: Secondary | ICD-10-CM | POA: Diagnosis not present

## 2023-04-19 DIAGNOSIS — M19019 Primary osteoarthritis, unspecified shoulder: Secondary | ICD-10-CM | POA: Diagnosis not present

## 2023-04-19 DIAGNOSIS — Z79899 Other long term (current) drug therapy: Secondary | ICD-10-CM | POA: Diagnosis not present

## 2023-04-19 DIAGNOSIS — M25571 Pain in right ankle and joints of right foot: Secondary | ICD-10-CM | POA: Diagnosis not present

## 2023-04-20 DIAGNOSIS — M25512 Pain in left shoulder: Secondary | ICD-10-CM | POA: Diagnosis not present

## 2023-04-20 DIAGNOSIS — M25612 Stiffness of left shoulder, not elsewhere classified: Secondary | ICD-10-CM | POA: Diagnosis not present

## 2023-04-20 DIAGNOSIS — Z96612 Presence of left artificial shoulder joint: Secondary | ICD-10-CM | POA: Diagnosis not present

## 2023-04-20 DIAGNOSIS — M6281 Muscle weakness (generalized): Secondary | ICD-10-CM | POA: Diagnosis not present

## 2023-04-25 DIAGNOSIS — M25612 Stiffness of left shoulder, not elsewhere classified: Secondary | ICD-10-CM | POA: Diagnosis not present

## 2023-04-25 DIAGNOSIS — M6281 Muscle weakness (generalized): Secondary | ICD-10-CM | POA: Diagnosis not present

## 2023-04-25 DIAGNOSIS — Z96612 Presence of left artificial shoulder joint: Secondary | ICD-10-CM | POA: Diagnosis not present

## 2023-04-25 DIAGNOSIS — M25512 Pain in left shoulder: Secondary | ICD-10-CM | POA: Diagnosis not present

## 2023-05-10 DIAGNOSIS — M25612 Stiffness of left shoulder, not elsewhere classified: Secondary | ICD-10-CM | POA: Diagnosis not present

## 2023-05-10 DIAGNOSIS — Z96612 Presence of left artificial shoulder joint: Secondary | ICD-10-CM | POA: Diagnosis not present

## 2023-05-10 DIAGNOSIS — M25512 Pain in left shoulder: Secondary | ICD-10-CM | POA: Diagnosis not present

## 2023-05-10 DIAGNOSIS — M6281 Muscle weakness (generalized): Secondary | ICD-10-CM | POA: Diagnosis not present

## 2023-05-16 DIAGNOSIS — M25512 Pain in left shoulder: Secondary | ICD-10-CM | POA: Diagnosis not present

## 2023-05-16 DIAGNOSIS — M6281 Muscle weakness (generalized): Secondary | ICD-10-CM | POA: Diagnosis not present

## 2023-05-16 DIAGNOSIS — Z96612 Presence of left artificial shoulder joint: Secondary | ICD-10-CM | POA: Diagnosis not present

## 2023-05-16 DIAGNOSIS — M25612 Stiffness of left shoulder, not elsewhere classified: Secondary | ICD-10-CM | POA: Diagnosis not present

## 2023-05-18 DIAGNOSIS — M6281 Muscle weakness (generalized): Secondary | ICD-10-CM | POA: Diagnosis not present

## 2023-05-18 DIAGNOSIS — M25612 Stiffness of left shoulder, not elsewhere classified: Secondary | ICD-10-CM | POA: Diagnosis not present

## 2023-05-18 DIAGNOSIS — Z96612 Presence of left artificial shoulder joint: Secondary | ICD-10-CM | POA: Diagnosis not present

## 2023-05-18 DIAGNOSIS — M25512 Pain in left shoulder: Secondary | ICD-10-CM | POA: Diagnosis not present

## 2023-05-23 DIAGNOSIS — Z96612 Presence of left artificial shoulder joint: Secondary | ICD-10-CM | POA: Diagnosis not present

## 2023-05-23 DIAGNOSIS — M25512 Pain in left shoulder: Secondary | ICD-10-CM | POA: Diagnosis not present

## 2023-05-23 DIAGNOSIS — M25612 Stiffness of left shoulder, not elsewhere classified: Secondary | ICD-10-CM | POA: Diagnosis not present

## 2023-05-23 DIAGNOSIS — M6281 Muscle weakness (generalized): Secondary | ICD-10-CM | POA: Diagnosis not present

## 2023-05-30 DIAGNOSIS — M19012 Primary osteoarthritis, left shoulder: Secondary | ICD-10-CM | POA: Diagnosis not present

## 2023-05-30 DIAGNOSIS — Z79891 Long term (current) use of opiate analgesic: Secondary | ICD-10-CM | POA: Diagnosis not present

## 2023-05-30 DIAGNOSIS — M75121 Complete rotator cuff tear or rupture of right shoulder, not specified as traumatic: Secondary | ICD-10-CM | POA: Diagnosis not present

## 2023-05-30 DIAGNOSIS — M19172 Post-traumatic osteoarthritis, left ankle and foot: Secondary | ICD-10-CM | POA: Diagnosis not present

## 2023-05-30 DIAGNOSIS — M7521 Bicipital tendinitis, right shoulder: Secondary | ICD-10-CM | POA: Diagnosis not present

## 2023-06-01 DIAGNOSIS — M25612 Stiffness of left shoulder, not elsewhere classified: Secondary | ICD-10-CM | POA: Diagnosis not present

## 2023-06-01 DIAGNOSIS — M25512 Pain in left shoulder: Secondary | ICD-10-CM | POA: Diagnosis not present

## 2023-06-01 DIAGNOSIS — M6281 Muscle weakness (generalized): Secondary | ICD-10-CM | POA: Diagnosis not present

## 2023-06-01 DIAGNOSIS — Z96612 Presence of left artificial shoulder joint: Secondary | ICD-10-CM | POA: Diagnosis not present

## 2023-06-06 DIAGNOSIS — M25512 Pain in left shoulder: Secondary | ICD-10-CM | POA: Diagnosis not present

## 2023-06-06 DIAGNOSIS — M25612 Stiffness of left shoulder, not elsewhere classified: Secondary | ICD-10-CM | POA: Diagnosis not present

## 2023-06-06 DIAGNOSIS — M6281 Muscle weakness (generalized): Secondary | ICD-10-CM | POA: Diagnosis not present

## 2023-06-06 DIAGNOSIS — Z96612 Presence of left artificial shoulder joint: Secondary | ICD-10-CM | POA: Diagnosis not present

## 2023-06-08 DIAGNOSIS — Z96612 Presence of left artificial shoulder joint: Secondary | ICD-10-CM | POA: Diagnosis not present

## 2023-06-08 DIAGNOSIS — M6281 Muscle weakness (generalized): Secondary | ICD-10-CM | POA: Diagnosis not present

## 2023-06-08 DIAGNOSIS — M25512 Pain in left shoulder: Secondary | ICD-10-CM | POA: Diagnosis not present

## 2023-06-08 DIAGNOSIS — M25612 Stiffness of left shoulder, not elsewhere classified: Secondary | ICD-10-CM | POA: Diagnosis not present

## 2023-06-14 DIAGNOSIS — M25512 Pain in left shoulder: Secondary | ICD-10-CM | POA: Diagnosis not present

## 2023-06-26 DIAGNOSIS — Z96612 Presence of left artificial shoulder joint: Secondary | ICD-10-CM | POA: Diagnosis not present

## 2023-06-27 DIAGNOSIS — M6281 Muscle weakness (generalized): Secondary | ICD-10-CM | POA: Diagnosis not present

## 2023-06-27 DIAGNOSIS — Z96612 Presence of left artificial shoulder joint: Secondary | ICD-10-CM | POA: Diagnosis not present

## 2023-06-27 DIAGNOSIS — M25512 Pain in left shoulder: Secondary | ICD-10-CM | POA: Diagnosis not present

## 2023-06-27 DIAGNOSIS — M25612 Stiffness of left shoulder, not elsewhere classified: Secondary | ICD-10-CM | POA: Diagnosis not present

## 2023-06-30 DIAGNOSIS — M75121 Complete rotator cuff tear or rupture of right shoulder, not specified as traumatic: Secondary | ICD-10-CM | POA: Diagnosis not present

## 2023-06-30 DIAGNOSIS — M7521 Bicipital tendinitis, right shoulder: Secondary | ICD-10-CM | POA: Diagnosis not present

## 2023-06-30 DIAGNOSIS — M19012 Primary osteoarthritis, left shoulder: Secondary | ICD-10-CM | POA: Diagnosis not present

## 2023-06-30 DIAGNOSIS — M19172 Post-traumatic osteoarthritis, left ankle and foot: Secondary | ICD-10-CM | POA: Diagnosis not present

## 2023-06-30 DIAGNOSIS — Z79891 Long term (current) use of opiate analgesic: Secondary | ICD-10-CM | POA: Diagnosis not present

## 2023-08-22 DIAGNOSIS — I10 Essential (primary) hypertension: Secondary | ICD-10-CM | POA: Diagnosis not present

## 2023-08-22 DIAGNOSIS — K5790 Diverticulosis of intestine, part unspecified, without perforation or abscess without bleeding: Secondary | ICD-10-CM | POA: Diagnosis not present

## 2023-08-22 DIAGNOSIS — M19172 Post-traumatic osteoarthritis, left ankle and foot: Secondary | ICD-10-CM | POA: Diagnosis not present

## 2023-08-22 DIAGNOSIS — Z79891 Long term (current) use of opiate analgesic: Secondary | ICD-10-CM | POA: Diagnosis not present

## 2023-08-22 DIAGNOSIS — K589 Irritable bowel syndrome without diarrhea: Secondary | ICD-10-CM | POA: Diagnosis not present

## 2023-09-26 DIAGNOSIS — I10 Essential (primary) hypertension: Secondary | ICD-10-CM | POA: Diagnosis not present

## 2023-09-26 DIAGNOSIS — F410 Panic disorder [episodic paroxysmal anxiety] without agoraphobia: Secondary | ICD-10-CM | POA: Diagnosis not present

## 2023-09-26 DIAGNOSIS — E7800 Pure hypercholesterolemia, unspecified: Secondary | ICD-10-CM | POA: Diagnosis not present

## 2023-09-26 DIAGNOSIS — R7303 Prediabetes: Secondary | ICD-10-CM | POA: Diagnosis not present

## 2023-10-25 DIAGNOSIS — H10023 Other mucopurulent conjunctivitis, bilateral: Secondary | ICD-10-CM | POA: Diagnosis not present

## 2023-12-24 DIAGNOSIS — M545 Low back pain, unspecified: Secondary | ICD-10-CM | POA: Diagnosis not present
